# Patient Record
Sex: Male | Born: 1960 | Race: White | Hispanic: No | Marital: Single | Smoking: Former smoker
Health system: Southern US, Community
[De-identification: ages and names within clinical notes are randomized; demographics above are authoritative.]

## PROBLEM LIST (undated history)

## (undated) DIAGNOSIS — I10 Essential (primary) hypertension: Secondary | ICD-10-CM

## (undated) DIAGNOSIS — T7840XA Allergy, unspecified, initial encounter: Secondary | ICD-10-CM

## (undated) DIAGNOSIS — E7800 Pure hypercholesterolemia, unspecified: Secondary | ICD-10-CM

## (undated) HISTORY — PX: OTHER SURGICAL HISTORY: SHX169

## (undated) HISTORY — DX: Allergy, unspecified, initial encounter: T78.40XA

---

## 2003-11-14 ENCOUNTER — Emergency Department (HOSPITAL_COMMUNITY): Admission: EM | Admit: 2003-11-14 | Discharge: 2003-11-14 | Payer: Self-pay | Admitting: Internal Medicine

## 2016-02-07 ENCOUNTER — Emergency Department (HOSPITAL_COMMUNITY): Payer: Worker's Compensation

## 2016-02-07 ENCOUNTER — Encounter (HOSPITAL_COMMUNITY): Payer: Self-pay | Admitting: Emergency Medicine

## 2016-02-07 ENCOUNTER — Emergency Department (HOSPITAL_COMMUNITY)
Admission: EM | Admit: 2016-02-07 | Discharge: 2016-02-07 | Disposition: A | Discharge status: Home / Self Care | Payer: Worker's Compensation | Attending: Emergency Medicine | Admitting: Emergency Medicine

## 2016-02-07 DIAGNOSIS — Y990 Civilian activity done for income or pay: Secondary | ICD-10-CM | POA: Diagnosis not present

## 2016-02-07 DIAGNOSIS — Z87891 Personal history of nicotine dependence: Secondary | ICD-10-CM | POA: Insufficient documentation

## 2016-02-07 DIAGNOSIS — X501XXA Overexertion from prolonged static or awkward postures, initial encounter: Secondary | ICD-10-CM | POA: Diagnosis not present

## 2016-02-07 DIAGNOSIS — S9001XA Contusion of right ankle, initial encounter: Secondary | ICD-10-CM | POA: Diagnosis not present

## 2016-02-07 DIAGNOSIS — Y9389 Activity, other specified: Secondary | ICD-10-CM | POA: Diagnosis not present

## 2016-02-07 DIAGNOSIS — Y9289 Other specified places as the place of occurrence of the external cause: Secondary | ICD-10-CM | POA: Diagnosis not present

## 2016-02-07 DIAGNOSIS — S99911A Unspecified injury of right ankle, initial encounter: Secondary | ICD-10-CM | POA: Diagnosis present

## 2016-02-07 DIAGNOSIS — S8254XA Nondisplaced fracture of medial malleolus of right tibia, initial encounter for closed fracture: Secondary | ICD-10-CM | POA: Diagnosis not present

## 2016-02-07 DIAGNOSIS — S8251XA Displaced fracture of medial malleolus of right tibia, initial encounter for closed fracture: Secondary | ICD-10-CM

## 2016-02-07 MED ORDER — HYDROCODONE-ACETAMINOPHEN 5-325 MG PO TABS: 1.0000 | ORAL_TABLET | ORAL | Status: DC | PRN

## 2016-02-07 NOTE — Discharge Instructions (Signed)
Continue ibuprofen for pain. Take Norco for severe pain only. Keep your ankle elevated as much as possible during the day and at nighttime. Do not remove the splint. Ice several times a day. Please call and follow-up with orthopedic specialist as soon as able. Use crutches for ambulation, do not put any weight on your ankle.   Ankle Fracture A fracture is a break in a bone. The ankle joint is made up of three bones. These include the lower (distal)sections of your lower leg bones, called the tibia and fibula, along with a bone in your foot, called the talus. Depending on how bad the break is and if more than one ankle joint bone is broken, a cast or splint is used to protect and keep your injured bone from moving while it heals. Sometimes, surgery is required to help the fracture heal properly.  There are two general types of fractures:  Stable fracture. This includes a single fracture line through one bone, with no injury to ankle ligaments. A fracture of the talus that does not have any displacement (movement of the bone on either side of the fracture line) is also stable.  Unstable fracture. This includes more than one fracture line through one or more bones in the ankle joint. It also includes fractures that have displacement of the bone on either side of the fracture line. CAUSES  A direct blow to the ankle.   Quickly and severely twisting your ankle.  Trauma, such as a car accident or falling from a significant height. RISK FACTORS You may be at a higher risk of ankle fracture if:  You have certain medical conditions.  You are involved in high-impact sports.  You are involved in a high-impact car accident. SIGNS AND SYMPTOMS   Tender and swollen ankle.  Bruising around the injured ankle.  Pain on movement of the ankle.  Difficulty walking or putting weight on the ankle.  A cold foot below the site of the ankle injury. This can occur if the blood vessels passing through your  injured ankle were also damaged.  Numbness in the foot below the site of the ankle injury. DIAGNOSIS  An ankle fracture is usually diagnosed with a physical exam and X-rays. A CT scan may also be required for complex fractures. TREATMENT  Stable fractures are treated with a cast or splint and using crutches to avoid putting weight on your injured ankle. This is followed by an ankle strengthening program. Some patients require a special type of cast, depending on other medical problems they may have. Unstable fractures require surgery to ensure the bones heal properly. Your health care provider will tell you what type of fracture you have and the best treatment for your condition. HOME CARE INSTRUCTIONS   Review correct crutch use with your health care provider and use your crutches as directed. Safe use of crutches is extremely important. Misuse of crutches can cause you to fall or cause injury to nerves in your hands or armpits.  Do not put weight or pressure on the injured ankle until directed by your health care provider.  To lessen the swelling, keep the injured leg elevated while sitting or lying down.  Apply ice to the injured area:  Put ice in a plastic bag.  Place a towel between your cast and the bag.  Leave the ice on for 20 minutes, 2-3 times a day.  If you have a plaster or fiberglass cast:  Do not try to scratch the skin  under the cast with any objects. This can increase your risk of skin infection.  Check the skin around the cast every day. You may put lotion on any red or sore areas.  Keep your cast dry and clean.  If you have a plaster splint:  Wear the splint as directed.  You may loosen the elastic around the splint if your toes become numb, tingle, or turn cold or blue.  Do not put pressure on any part of your cast or splint; it may break. Rest your cast only on a pillow the first 24 hours until it is fully hardened.  Your cast or splint can be protected  during bathing with a plastic bag sealed to your skin with medical tape. Do not lower the cast or splint into water.  Take medicines as directed by your health care provider. Only take over-the-counter or prescription medicines for pain, discomfort, or fever as directed by your health care provider.  Do not drive a vehicle until your health care provider specifically tells you it is safe to do so.  If your health care provider has given you a follow-up appointment, it is very important to keep that appointment. Not keeping the appointment could result in a chronic or permanent injury, pain, and disability. If you have any problem keeping the appointment, call the facility for assistance. SEEK MEDICAL CARE IF: You develop increased swelling or discomfort. SEEK IMMEDIATE MEDICAL CARE IF:   Your cast gets damaged or breaks.  You have continued severe pain.  You develop new pain or swelling after the cast was put on.  Your skin or toenails below the injury turn blue or gray.  Your skin or toenails below the injury feel cold, numb, or have loss of sensitivity to touch.  There is a bad smell or pus draining from under the cast. MAKE SURE YOU:   Understand these instructions.  Will watch your condition.  Will get help right away if you are not doing well or get worse.   This information is not intended to replace advice given to you by your health care provider. Make sure you discuss any questions you have with your health care provider.   Document Released: 10/31/2000 Document Revised: 11/08/2013 Document Reviewed: 06/02/2013 Elsevier Interactive Patient Education 2016 [employer]  Cast or Splint Care Casts and splints support injured limbs and keep bones from moving while they heal. It is important to care for your cast or splint at home.  HOME CARE INSTRUCTIONS  Keep the cast or splint uncovered during the drying period. It can take 24 to 48 hours to dry if it is made of  plaster. A fiberglass cast will dry in less than 1 hour.  Do not rest the cast on anything harder than a pillow for the first 24 hours.  Do not put weight on your injured limb or apply pressure to the cast until your health care provider gives you permission.  Keep the cast or splint dry. Wet casts or splints can lose their shape and may not support the limb as well. A wet cast that has lost its shape can also create harmful pressure on your skin when it dries. Also, wet skin can become infected.  Cover the cast or splint with a plastic bag when bathing or when out in the rain or snow. If the cast is on the trunk of the body, take sponge baths until the cast is removed.  If your cast does become wet, dry  it with a towel or a blow dryer on the cool setting only.  Keep your cast or splint clean. Soiled casts may be wiped with a moistened cloth.  Do not place any hard or soft foreign objects under your cast or splint, such as cotton, toilet paper, lotion, or powder.  Do not try to scratch the skin under the cast with any object. The object could get stuck inside the cast. Also, scratching could lead to an infection. If itching is a problem, use a blow dryer on a cool setting to relieve discomfort.  Do not trim or cut your cast or remove padding from inside of it.  Exercise all joints next to the injury that are not immobilized by the cast or splint. For example, if you have a long leg cast, exercise the hip joint and toes. If you have an arm cast or splint, exercise the shoulder, elbow, thumb, and fingers.  Elevate your injured arm or leg on 1 or 2 pillows for the first 1 to 3 days to decrease swelling and pain.It is best if you can comfortably elevate your cast so it is higher than your heart. SEEK MEDICAL CARE IF:   Your cast or splint cracks.  Your cast or splint is too tight or too loose.  You have unbearable itching inside the cast.  Your cast becomes wet or develops a soft spot or  area.  You have a bad smell coming from inside your cast.  You get an object stuck under your cast.  Your skin around the cast becomes red or raw.  You have new pain or worsening pain after the cast has been applied. SEEK IMMEDIATE MEDICAL CARE IF:   You have fluid leaking through the cast.  You are unable to move your fingers or toes.  You have discolored (blue or white), cool, painful, or very swollen fingers or toes beyond the cast.  You have tingling or numbness around the injured area.  You have severe pain or pressure under the cast.  You have any difficulty with your breathing or have shortness of breath.  You have chest pain.   This information is not intended to replace advice given to you by your health care provider. Make sure you discuss any questions you have with your health care provider.   Document Released: 10/31/2000 Document Revised: 08/24/2013 Document Reviewed: 05/12/2013 Elsevier Interactive Patient Education [employer]

## 2016-02-07 NOTE — ED Provider Notes (Signed)
CSN: [id number]     Arrival date & time 02/07/16  0421 History   First MD Initiated Contact with Patient 02/07/16 [phone]     Chief Complaint  Patient presents with  . 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 Patient states he was at work, states he was moving heavy dense of tobacco, when his ankle twisted. He states he has pain when walking on the ankle and reports swelling and bruising to the medial aspect. He reports he elevated at night, took ibuprofen which helped, but it continues to be painful and he still cannot walk on it. He denies any prior ankle injuries. He denies any knee pain. He denies any numbness or weakness to the foot or toes. He denies any other injuries or fall.  History reviewed. No pertinent past medical history. History reviewed. No pertinent past surgical history. Family History  Problem Relation Age of Onset  . CAD Mother   . CAD Father    Social History  Substance Use Topics  . Smoking status: Former Games developermoker  . Smokeless tobacco: None  . Alcohol Use: No    Review of Systems  Constitutional: Negative for fever and chills.  Respiratory: Negative for cough, chest tightness and shortness of breath.   Cardiovascular: Negative for chest pain, palpitations and leg swelling.  Musculoskeletal: Positive for joint swelling and arthralgias.  Skin: Negative for rash.  Allergic/Immunologic: Negative for immunocompromised state.  Neurological: Negative for dizziness, weakness, light-headedness, numbness and headaches.  All other systems reviewed and are negative.     Allergies  Review of patient's allergies indicates no known allergies.  Home Medications   Prior to Admission medications   Not on File   BP 113/84 mmHg  Pulse 67  Temp(Src) 97.6 F (36.4 C) (Oral)  Resp  18  SpO2 99% Physical Exam  Constitutional: He is oriented to person, place, and time. He appears well-developed and well-nourished. No distress.  HENT:  Head: Normocephalic and atraumatic.  Eyes: Conjunctivae are normal.  Neck: Neck supple.  Cardiovascular: Normal rate, regular rhythm and normal heart sounds.   Pulmonary/Chest: Effort normal. No respiratory distress. He has no wheezes. He has no rales.  Musculoskeletal: He exhibits no edema.  Moderate swelling noted to the right ankle. There is bruising of the medial aspect of the ankle. Achilles tendon is tender but appears to be intact. Muscular tenderness over medial malleolus. Foot appears to be normal without tenderness to palpation. Dorsal pedal pulse intact. Toes are pink, warm. Sensation is intact. Cap refill less than 2 seconds. Swelling extends into the calf. No knee joint tenderness or pain with range of motion.  Neurological: He is alert and oriented to person, place, and time.  Skin: Skin is warm and dry.  Nursing note and vitals reviewed.   ED Course  Procedures (including critical care time) Labs Review Labs Reviewed - No data to display  Imaging Review Dg Ankle Complete Right  02/07/2016  CLINICAL DATA:  Initial evaluation for acute injury, swelling. EXAM: RIGHT ANKLE - COMPLETE 3+ VIEW COMPARISON:  None. FINDINGS: There is an acute transverse fracture through the medial malleoli S. 3 mm distraction without significant angulation or displacement. Osseous fragment just inferior to the fracture malleable is likely a sequela of remote trauma. Lateral malleoli S and posterior malleable is intact. Talar dome intact. Ankle mortise approximated. Diffuse soft tissue  swelling about the ankle. IMPRESSION: 1. Acute transverse fracture through the medial malleolus without significant displacement. 2. Diffuse soft tissue swelling about the ankle. Electronically Signed   By: Rise Mu M.D.   On: 02/07/2016 05:53   I have  personally reviewed and evaluated these images and lab results as part of my medical decision-making.   EKG Interpretation None      MDM   Final diagnoses:  Fractured medial malleolus, right, closed, initial encounter   Patient with twisting injury to the right ankle last night. He has significant swelling and bruising to the medial aspect of the ankle. X-ray shows acute transverse fracture through medial malleolus. There is no displacement. Discussed results with patient. Will placed in a splint, crutches provided, ibuprofen and Norco for pain at home. Follow with orthopedic specialist. To keep ankle elevated to minimize the swelling, ice several times a day. No weightbearing.  Filed Vitals:   02/07/16 0451 02/07/16 0816  BP: 113/84 120/88  Pulse: 67 70  Temp: 97.6 F (36.4 C)   TempSrc: Oral   Resp: 18 18  SpO2: 99% 100%     Jaynie Crumble, PA-C 02/07/16 0841  Azalia Bilis, MD 02/07/16 [phone]

## 2016-02-07 NOTE — ED Notes (Signed)
Pt states he injured his right ankle yesterday at work  Pt states he iced it and wrapped it with an ace wrap  Pt states tonight he got out of bed and retwisted it and the pain is much worse  Pt has swelling noted to the ankle and pt states he cannot bear weight on it now

## 2016-05-04 ENCOUNTER — Encounter (HOSPITAL_COMMUNITY): Payer: Self-pay | Admitting: Emergency Medicine

## 2016-05-04 ENCOUNTER — Emergency Department (HOSPITAL_COMMUNITY)
Admission: EM | Admit: 2016-05-04 | Discharge: 2016-05-04 | Disposition: A | Discharge status: Home / Self Care | Payer: Commercial Managed Care - HMO | Attending: Emergency Medicine | Admitting: Emergency Medicine

## 2016-05-04 DIAGNOSIS — R0602 Shortness of breath: Secondary | ICD-10-CM | POA: Diagnosis present

## 2016-05-04 DIAGNOSIS — Z79899 Other long term (current) drug therapy: Secondary | ICD-10-CM | POA: Diagnosis not present

## 2016-05-04 DIAGNOSIS — I10 Essential (primary) hypertension: Secondary | ICD-10-CM | POA: Diagnosis not present

## 2016-05-04 DIAGNOSIS — Y939 Activity, unspecified: Secondary | ICD-10-CM | POA: Insufficient documentation

## 2016-05-04 DIAGNOSIS — R42 Dizziness and giddiness: Secondary | ICD-10-CM | POA: Insufficient documentation

## 2016-05-04 DIAGNOSIS — T7840XA Allergy, unspecified, initial encounter: Secondary | ICD-10-CM | POA: Insufficient documentation

## 2016-05-04 DIAGNOSIS — S30861A Insect bite (nonvenomous) of abdominal wall, initial encounter: Secondary | ICD-10-CM

## 2016-05-04 DIAGNOSIS — W57XXXA Bitten or stung by nonvenomous insect and other nonvenomous arthropods, initial encounter: Secondary | ICD-10-CM | POA: Diagnosis not present

## 2016-05-04 DIAGNOSIS — Y999 Unspecified external cause status: Secondary | ICD-10-CM | POA: Diagnosis not present

## 2016-05-04 DIAGNOSIS — Z87891 Personal history of nicotine dependence: Secondary | ICD-10-CM | POA: Diagnosis not present

## 2016-05-04 DIAGNOSIS — Z791 Long term (current) use of non-steroidal anti-inflammatories (NSAID): Secondary | ICD-10-CM | POA: Diagnosis not present

## 2016-05-04 DIAGNOSIS — Y929 Unspecified place or not applicable: Secondary | ICD-10-CM | POA: Insufficient documentation

## 2016-05-04 HISTORY — DX: Pure hypercholesterolemia, unspecified: E78.00

## 2016-05-04 HISTORY — DX: Essential (primary) hypertension: I10

## 2016-05-04 LAB — I-STAT CHEM 8, ED
BUN: 18 mg/dL (ref 6–20)
Calcium, Ion: 1.11 mmol/L — ABNORMAL LOW (ref 1.12–1.23)
Chloride: 97 mmol/L — ABNORMAL LOW (ref 101–111)
Creatinine, Ser: 1 mg/dL (ref 0.61–1.24)
GLUCOSE: 108 mg/dL — AB (ref 65–99)
HEMATOCRIT: 54 % — AB (ref 39.0–52.0)
HEMOGLOBIN: 18.4 g/dL — AB (ref 13.0–17.0)
POTASSIUM: 3.8 mmol/L (ref 3.5–5.1)
Sodium: 136 mmol/L (ref 135–145)
TCO2: 25 mmol/L (ref 0–100)

## 2016-05-04 LAB — CBC WITH DIFFERENTIAL/PLATELET
BASOS ABS: 0 10*3/uL (ref 0.0–0.1)
BASOS PCT: 0 %
EOS PCT: 0 %
Eosinophils Absolute: 0 10*3/uL (ref 0.0–0.7)
HCT: 51 % (ref 39.0–52.0)
Hemoglobin: 18.3 g/dL — ABNORMAL HIGH (ref 13.0–17.0)
Lymphocytes Relative: 11 %
Lymphs Abs: 2 10*3/uL (ref 0.7–4.0)
MCH: 32.5 pg (ref 26.0–34.0)
MCHC: 35.9 g/dL (ref 30.0–36.0)
MCV: 90.6 fL (ref 78.0–100.0)
MONO ABS: 0.9 10*3/uL (ref 0.1–1.0)
Monocytes Relative: 5 %
Neutro Abs: 14.2 10*3/uL — ABNORMAL HIGH (ref 1.7–7.7)
Neutrophils Relative %: 84 %
PLATELETS: 310 10*3/uL (ref 150–400)
RBC: 5.63 MIL/uL (ref 4.22–5.81)
RDW: 11.8 % (ref 11.5–15.5)
WBC: 17 10*3/uL — ABNORMAL HIGH (ref 4.0–10.5)

## 2016-05-04 MED ORDER — DOXYCYCLINE HYCLATE 100 MG PO TABS: 100.0000 mg | ORAL_TABLET | Freq: Two times a day (BID) | ORAL | Status: DC

## 2016-05-04 MED ORDER — FAMOTIDINE IN NACL 20-0.9 MG/50ML-% IV SOLN
20.0000 mg | Freq: Once | INTRAVENOUS | Status: AC
  Administered 2016-05-04: 20 mg via INTRAVENOUS
  Filled 2016-05-04: qty 50

## 2016-05-04 MED ORDER — DIPHENHYDRAMINE HCL 50 MG/ML IJ SOLN
25.0000 mg | Freq: Once | INTRAMUSCULAR | Status: AC
  Administered 2016-05-04: 25 mg via INTRAVENOUS
  Filled 2016-05-04: qty 1

## 2016-05-04 MED ORDER — EPINEPHRINE 0.3 MG/0.3ML IJ SOAJ: 0.3000 mg | Freq: Once | INTRAMUSCULAR | Status: DC

## 2016-05-04 MED ORDER — FAMOTIDINE 20 MG PO TABS: 20.0000 mg | ORAL_TABLET | Freq: Two times a day (BID) | ORAL | Status: DC

## 2016-05-04 MED ORDER — METHYLPREDNISOLONE SODIUM SUCC 125 MG IJ SOLR
125.0000 mg | Freq: Once | INTRAMUSCULAR | Status: AC
  Administered 2016-05-04: 125 mg via INTRAVENOUS
  Filled 2016-05-04: qty 2

## 2016-05-04 MED ORDER — SODIUM CHLORIDE 0.9 % IV BOLUS (SEPSIS)
1000.0000 mL | Freq: Once | INTRAVENOUS | Status: AC
  Administered 2016-05-04: 1000 mL via INTRAVENOUS

## 2016-05-04 MED ORDER — PREDNISONE 10 MG PO TABS: 20.0000 mg | ORAL_TABLET | Freq: Every day | ORAL | Status: DC

## 2016-05-04 MED ORDER — DOXYCYCLINE HYCLATE 100 MG PO TABS
100.0000 mg | ORAL_TABLET | Freq: Once | ORAL | Status: AC
  Administered 2016-05-04: 100 mg via ORAL
  Filled 2016-05-04: qty 1

## 2016-05-04 NOTE — ED Provider Notes (Signed)
CSN: [id number]     Arrival date & time 05/04/16  2122 History   First MD Initiated Contact with Patient 05/04/16 2126     Chief Complaint  Patient presents with  . Dizziness  . Allergic Reaction     (Consider location/radiation/quality/duration/timing/severity/associated sxs/prior Treatment) HPI Comments: This a 55 year old male with a history of hypertension, high cholesterol who states that he went out for dinner with his family for Father's Day.  He had a hamburger and shortly after that he became dizzy, developed a urticarial rash had some shortness of breath and diffuse peripheral swelling.  He has taken 75 mg of by mouth Benadryl with little relief.  He states if he stands or changes positions using chronically dizzy.  His family member says when this happens he gets very pale.  He does report that he has taken for tics off of areas parts of his body since March, the last one approximately 2 weeks ago.  These all were removed.  Complete he did develop a reddened area at the site of the tick bite, but no rash noted.  The locations.  Patient is a 55 y.o. male presenting with dizziness and allergic reaction. The history is provided by the patient.  Dizziness Quality:  Unable to specify Severity:  Moderate Onset quality:  Gradual Duration:  2 hours Timing:  Constant Progression:  Worsening Chronicity:  New Context: standing up   Relieved by:  Lying down Worsened by:  Sitting upright and movement Ineffective treatments:  None tried Associated symptoms: palpitations and shortness of breath   Associated symptoms: no headaches   Allergic Reaction Presenting symptoms: rash     Past Medical History  Diagnosis Date  . Hypertension   . Elevated cholesterol    History reviewed. No pertinent past surgical history. Family History  Problem Relation Age of Onset  . CAD Mother   . CAD Father    Social History  Substance Use Topics  . Smoking status: Former Games developermoker  . Smokeless  tobacco: None  . Alcohol Use: No    Review of Systems  Constitutional: Negative for fever and chills.  Respiratory: Positive for shortness of breath.   Cardiovascular: Positive for palpitations.  Musculoskeletal: Positive for joint swelling.  Skin: Positive for rash.  Neurological: Positive for dizziness. Negative for syncope and headaches.  All other systems reviewed and are negative.     Allergies  Review of patient's allergies indicates no known allergies.  Home Medications   Prior to Admission medications   Medication Sig Start Date End Date Taking? Authorizing Provider  ibuprofen (ADVIL,MOTRIN) 400 MG tablet Take 400 mg by mouth every 6 (six) hours as needed.   Yes Historical Provider, MD  loratadine (CLARITIN) 10 MG tablet Take 10 mg by mouth daily.   Yes Historical Provider, MD  losartan (COZAAR) 50 MG tablet Take 1 tablet by mouth daily. 04/01/16  Yes Historical Provider, MD  rosuvastatin (CRESTOR) 20 MG tablet Take 20 mg by mouth daily. 03/15/16  Yes Historical Provider, MD  doxycycline (VIBRA-TABS) 100 MG tablet Take 1 tablet (100 mg total) by mouth 2 (two) times daily. 05/04/16   Earley FavorGail Kyasia Steuck, NP  EPINEPHrine (EPIPEN 2-PAK) 0.3 mg/0.3 mL IJ SOAJ injection Inject 0.3 mLs (0.3 mg total) into the muscle once. 05/04/16   Earley FavorGail Nehemias Sauceda, NP  famotidine (PEPCID) 20 MG tablet Take 1 tablet (20 mg total) by mouth 2 (two) times daily. 05/04/16   Earley FavorGail Mckade Gurka, NP  HYDROcodone-acetaminophen (NORCO) 5-325 MG tablet Take 1 tablet  by mouth every 4 (four) hours as needed for moderate pain. 02/07/16   Tatyana Kirichenko, PA-C  predniSONE (DELTASONE) 10 MG tablet Take 2 tablets (20 mg total) by mouth daily. 05/04/16   Earley Favor, NP   BP 91/74 mmHg  Pulse 90  Temp(Src) 97.6 F (36.4 C) (Oral)  Resp 19  Ht  (1.727 m)  Wt 78.019 kg  BMI 26.16 kg/m2  SpO2 97% Physical Exam  Constitutional: He is oriented to person, place, and time. He appears well-developed and well-nourished.  HENT:   Head: Normocephalic.  Eyes: Pupils are equal, round, and reactive to light.  Neck: Normal range of motion.  Cardiovascular: Normal rate and regular rhythm.   Hypotensive  Pulmonary/Chest: Effort normal. No respiratory distress. He has no wheezes.  Abdominal: Soft.  Musculoskeletal: He exhibits edema.  Neurological: He is alert and oriented to person, place, and time.  Skin: Skin is warm. There is pallor.  Nursing note and vitals reviewed.   ED Course  Procedures (including critical care time) Labs Review Labs Reviewed  CBC WITH DIFFERENTIAL/PLATELET - Abnormal; Notable for the following:    WBC 17.0 (*)    Hemoglobin 18.3 (*)    Neutro Abs 14.2 (*)    All other components within normal limits  I-STAT CHEM 8, ED - Abnormal; Notable for the following:    Chloride 97 (*)    Glucose, Bld 108 (*)    Calcium, Ion 1.11 (*)    Hemoglobin 18.4 (*)    HCT 54.0 (*)    All other components within normal limits  B. BURGDORFI ANTIBODIES    Imaging Review No results found. I have personally reviewed and evaluated these images and lab results as part of my medical decision-making.   EKG Interpretation None    Patient's allergic reaction is improving.  He is no longer hypotensive nor tachycardic.  He is no longer having any discomfort, shortness of breath.  Swelling has decreased significantly.  His color has improved to normal.  He'll be discharged him with an EpiPen, prednisone burst for 5 days, Pepcid on a regular basis and due to the multiple recent tick bites.  I will start him on doxycycline 100 mg twice a day for 7 days.  A Lyme titer has been sent to the lab for evaluation.  This has been discussed with the patient if positive, he will be notified.  MDM   Final diagnoses:  Allergic reaction, initial encounter  Tick bite of abdomen, initial encounter         Earley Favor, NP 05/04/16 2322  Leta Baptist, MD 05/07/16 0800

## 2016-05-04 NOTE — Discharge Instructions (Signed)
You have been given a prescription for doxycycline.  Please take this as directed.  This is to treat tick born illnesses You have been given a prescription for an EpiPen.  Please fill this and keep this with you at all times use as directed.  If you start having shortness of breath, rash, lightheadedness, symptoms similar to tonight's episode and immediately call 911 for transport to the emergency department You have  been given a prescription for Pepcid which is a very potent antihistamine slim, similar to Benadryl, but without the sleepy side effect as well as a prescription for prednisone.  This is a potent steroid.  Please take this as directed until all tablets have been completed

## 2016-05-04 NOTE — ED Notes (Signed)
Pt reports understanding of discharge information. No questions at time of discharge 

## 2016-05-04 NOTE — ED Notes (Signed)
Pt reports improvement in symptoms after administration of medications.

## 2016-05-04 NOTE — ED Notes (Signed)
Pt reports redness and itching that developed around 1700 today. Pt reports having multiple tick bites over the last few weeks and pt reports eating beef today. Pt denies any food allergies and currently not experiencing any airway difficulties.

## 2016-05-04 NOTE — ED Notes (Signed)
Pt reports that he had dizziness that started today around 1730. Pt also reports redness and swelling. Pt reports pulling ticks off of chest recently. Pt alert and oriented x 4 at this time.

## 2016-05-06 LAB — B. BURGDORFI ANTIBODIES: B burgdorferi Ab IgG+IgM: <0.91 {ISR} (ref 0.00–0.90)

## 2017-01-03 DIAGNOSIS — R21 Rash and other nonspecific skin eruption: Secondary | ICD-10-CM | POA: Diagnosis not present

## 2017-01-17 DIAGNOSIS — E559 Vitamin D deficiency, unspecified: Secondary | ICD-10-CM | POA: Diagnosis not present

## 2017-01-17 DIAGNOSIS — E7800 Pure hypercholesterolemia, unspecified: Secondary | ICD-10-CM | POA: Diagnosis not present

## 2017-01-17 DIAGNOSIS — Z79899 Other long term (current) drug therapy: Secondary | ICD-10-CM | POA: Diagnosis not present

## 2017-01-17 DIAGNOSIS — I10 Essential (primary) hypertension: Secondary | ICD-10-CM | POA: Diagnosis not present

## 2017-01-31 DIAGNOSIS — E7800 Pure hypercholesterolemia, unspecified: Secondary | ICD-10-CM | POA: Diagnosis not present

## 2017-01-31 DIAGNOSIS — E559 Vitamin D deficiency, unspecified: Secondary | ICD-10-CM | POA: Diagnosis not present

## 2017-01-31 DIAGNOSIS — I10 Essential (primary) hypertension: Secondary | ICD-10-CM | POA: Diagnosis not present

## 2017-02-04 ENCOUNTER — Encounter (HOSPITAL_COMMUNITY): Payer: Self-pay | Admitting: Emergency Medicine

## 2017-02-04 ENCOUNTER — Emergency Department (HOSPITAL_COMMUNITY)
Admission: EM | Admit: 2017-02-04 | Discharge: 2017-02-04 | Disposition: A | Discharge status: Home / Self Care | Payer: Commercial Managed Care - HMO | Attending: Emergency Medicine | Admitting: Emergency Medicine

## 2017-02-04 DIAGNOSIS — Z87891 Personal history of nicotine dependence: Secondary | ICD-10-CM | POA: Diagnosis not present

## 2017-02-04 DIAGNOSIS — I10 Essential (primary) hypertension: Secondary | ICD-10-CM | POA: Insufficient documentation

## 2017-02-04 DIAGNOSIS — L509 Urticaria, unspecified: Secondary | ICD-10-CM | POA: Diagnosis not present

## 2017-02-04 DIAGNOSIS — R21 Rash and other nonspecific skin eruption: Secondary | ICD-10-CM | POA: Diagnosis present

## 2017-02-04 MED ORDER — FAMOTIDINE 20 MG PO TABS
20.0000 mg | ORAL_TABLET | Freq: Once | ORAL | Status: AC
Start: 2017-02-04 — End: 2017-02-04
  Administered 2017-02-04: 20 mg via ORAL
  Filled 2017-02-04: qty 1

## 2017-02-04 MED ORDER — PREDNISONE 20 MG PO TABS: 40.0000 mg | ORAL_TABLET | Freq: Every day | ORAL | 0 refills | Status: DC

## 2017-02-04 MED ORDER — PREDNISONE 20 MG PO TABS
60.0000 mg | ORAL_TABLET | Freq: Once | ORAL | Status: AC
  Administered 2017-02-04: 60 mg via ORAL
  Filled 2017-02-04: qty 3

## 2017-02-04 MED ORDER — DIPHENHYDRAMINE HCL 25 MG PO CAPS: 25.0000 mg | ORAL_CAPSULE | Freq: Four times a day (QID) | ORAL | 0 refills | Status: AC | PRN

## 2017-02-04 MED ORDER — DIPHENHYDRAMINE HCL 25 MG PO CAPS
25.0000 mg | ORAL_CAPSULE | Freq: Once | ORAL | Status: AC
  Administered 2017-02-04: 25 mg via ORAL
  Filled 2017-02-04: qty 1

## 2017-02-04 MED ORDER — FAMOTIDINE 20 MG PO TABS: 20.0000 mg | ORAL_TABLET | Freq: Two times a day (BID) | ORAL | 0 refills | Status: DC

## 2017-02-04 NOTE — ED Notes (Signed)
Pt states that he has had lyme disease and per pt he is not suppose to eat beef and ate some chili that had beef in it. Since then he has hives and welts  all over his body. Raised red bums on bilateral arms and trunk and a red rash in groin area. Pt states 2 weeks ago he had pulled another tick off of his skin,

## 2017-02-04 NOTE — ED Provider Notes (Signed)
[facility]-EMERGENCY DEPT Provider Note   CSN: [id number] Arrival date & time: 02/04/17  0541     History   Chief Complaint Chief Complaint  Patient presents with  . Rash    HPI Daniel Mack is a 56 y.o. male.  HPI Daniel Mack is a 56 y.o. male with hx of HTN, Alpha-gal allergy, presents to ED with complaint of a rash. Pt states rash started this morning. States has sensetivity and prior allergic reaction from eating meet given exposure to alpha-gal from a tick. Pt states he ate chilli yesterday which contained beef. States rash started this morning. Rash is erythematous, itchy, mainly on arms, legs, lower abdomen. Denies swelling of lips, tongue, throat. No difficulty breathing. No fever, chills, nausea, vomiting, malaise. Took 25mg  of benadryl this morning which helped.   Past Medical History:  Diagnosis Date  . Elevated cholesterol   . Hypertension     There are no active problems to display for this patient.   History reviewed. No pertinent surgical history.     Home Medications    Prior to Admission medications   Medication Sig Start Date End Date Taking? Authorizing Provider  doxycycline (VIBRA-TABS) 100 MG tablet Take 1 tablet (100 mg total) by mouth 2 (two) times daily. 05/04/16   Earley Favor, NP  EPINEPHrine (EPIPEN 2-PAK) 0.3 mg/0.3 mL IJ SOAJ injection Inject 0.3 mLs (0.3 mg total) into the muscle once. 05/04/16   Earley Favor, NP  famotidine (PEPCID) 20 MG tablet Take 1 tablet (20 mg total) by mouth 2 (two) times daily. 05/04/16   Earley Favor, NP  HYDROcodone-acetaminophen (NORCO) 5-325 MG tablet Take 1 tablet by mouth every 4 (four) hours as needed for moderate pain. 02/07/16   Jesly Hartmann, PA-C  ibuprofen (ADVIL,MOTRIN) 400 MG tablet Take 400 mg by mouth every 6 (six) hours as needed.    Historical Provider, MD  loratadine (CLARITIN) 10 MG tablet Take 10 mg by mouth daily.    Historical Provider, MD  losartan (COZAAR) 50 MG tablet Take 1 tablet by mouth  daily. 04/01/16   Historical Provider, MD  predniSONE (DELTASONE) 10 MG tablet Take 2 tablets (20 mg total) by mouth daily. 05/04/16   Earley Favor, NP  rosuvastatin (CRESTOR) 20 MG tablet Take 20 mg by mouth daily. 03/15/16   Historical Provider, MD    Family History Family History  Problem Relation Age of Onset  . CAD Mother   . CAD Father     Social History Social History  Substance Use Topics  . Smoking status: Former Games developer  . Smokeless tobacco: Never Used  . Alcohol use No     Allergies   Beef-derived products   Review of Systems Review of Systems  Constitutional: Negative for chills and fever.  Respiratory: Negative for cough, chest tightness and shortness of breath.   Cardiovascular: Negative for chest pain, palpitations and leg swelling.  Gastrointestinal: Negative for abdominal distention, abdominal pain, diarrhea, nausea and vomiting.  Genitourinary: Negative for dysuria, frequency, hematuria and urgency.  Musculoskeletal: Negative for arthralgias, myalgias, neck pain and neck stiffness.  Skin: Positive for rash.  Allergic/Immunologic: Negative for immunocompromised state.  Neurological: Negative for dizziness, weakness, light-headedness, numbness and headaches.  All other systems reviewed and are negative.    Physical Exam Updated Vital Signs BP (!) 140/92 (BP Location: Left Arm)   Pulse (!) 59   Temp 97.6 F (36.4 C) (Oral)   Resp 18   SpO2 96%   Physical Exam  Constitutional:  He appears well-developed and well-nourished. No distress.  HENT:  Head: Normocephalic and atraumatic.  No oral mucosal rash. No swelling of lips, tongue, throat.  Eyes: Conjunctivae are normal.  Neck: Neck supple.  Cardiovascular: Normal rate, regular rhythm and normal heart sounds.   Pulmonary/Chest: Effort normal. No respiratory distress. He has no wheezes. He has no rales.  No stridor  Musculoskeletal: He exhibits no edema.  Neurological: He is alert.  Skin: Skin is warm  and dry.  Erythematous, macular rash, in congruent patches to the bilateral arms, legs, lower abdomen, groin. Pruritic.   Nursing note and vitals reviewed.    ED Treatments / Results  Labs (all labs ordered are listed, but only abnormal results are displayed) Labs Reviewed - No data to display  EKG  EKG Interpretation None       Radiology No results found.  Procedures Procedures (including critical care time)  Medications Ordered in ED Medications  diphenhydrAMINE (BENADRYL) capsule 25 mg (not administered)  famotidine (PEPCID) tablet 20 mg (not administered)  predniSONE (DELTASONE) tablet 60 mg (not administered)     Initial Impression / Assessment and Plan / ED Course  I have reviewed the triage vital signs and the nursing notes.  Pertinent labs & imaging results that were available during my care of the patient were reviewed by me and considered in my medical decision making (see chart for details).    Pt in ED with rash to bilateral arms, legs, abdomen. Rash consistent with hives. No oral mucosa involvement. No tongue, lip, throat swelling. No difficulty breathing. Plan to dc home with benadryl, pepcid, prednisone burst. Follow up as needed.  Vitals:   02/04/17 0549 02/04/17 0710  BP: (!) 140/92 (!) 140/92  Pulse: (!) 59 66  Resp: 18 18  Temp: 97.6 F (36.4 C) 97.6 F (36.4 C)  TempSrc: Oral Oral  SpO2: 96% 100%     Final Clinical Impressions(s) / ED Diagnoses   Final diagnoses:  Urticaria    New Prescriptions Discharge Medication List as of 02/04/2017  7:06 AM    START taking these medications   Details  diphenhydrAMINE (BENADRYL) 25 mg capsule Take 1 capsule (25 mg total) by mouth every 6 (six) hours as needed., Starting Wed 02/04/2017, Print    !! famotidine (PEPCID) 20 MG tablet Take 1 tablet (20 mg total) by mouth 2 (two) times daily., Starting Wed 02/04/2017, Print    !! predniSONE (DELTASONE) 20 MG tablet Take 2 tablets (40 mg total) by  mouth daily., Starting Wed 02/04/2017, Print     !! - Potential duplicate medications found. Please discuss with provider.       Jaynie Crumbleatyana Sharada Albornoz, PA-C 02/04/17 1518    Dione Boozeavid Glick, MD 02/05/17 [phone]

## 2017-02-04 NOTE — ED Triage Notes (Addendum)
Pt presents to ED with rash to upper left arm and abdomen; pt was bitten by tick in July and now has a sensitivity to beef; pt ate beans around 8pm without realizing they contained beef and then awoke this morning with the rash; no oral swelling noted; pt took 1 benadryl PTA

## 2017-02-04 NOTE — Discharge Instructions (Signed)
Benadryl, pepcid, prednisone as prescribed. Follow up as needed. Return if worsening symptoms. Avoid beef products.

## 2017-02-07 DIAGNOSIS — R945 Abnormal results of liver function studies: Secondary | ICD-10-CM | POA: Diagnosis not present

## 2017-02-07 DIAGNOSIS — R7303 Prediabetes: Secondary | ICD-10-CM | POA: Diagnosis not present

## 2017-02-07 DIAGNOSIS — S30861A Insect bite (nonvenomous) of abdominal wall, initial encounter: Secondary | ICD-10-CM | POA: Diagnosis not present

## 2017-02-12 DIAGNOSIS — R945 Abnormal results of liver function studies: Secondary | ICD-10-CM | POA: Diagnosis not present

## 2017-02-16 DIAGNOSIS — R109 Unspecified abdominal pain: Secondary | ICD-10-CM | POA: Diagnosis not present

## 2017-02-16 DIAGNOSIS — K2970 Gastritis, unspecified, without bleeding: Secondary | ICD-10-CM | POA: Diagnosis not present

## 2017-03-16 DIAGNOSIS — Z7952 Long term (current) use of systemic steroids: Secondary | ICD-10-CM | POA: Diagnosis not present

## 2017-03-16 DIAGNOSIS — E291 Testicular hypofunction: Secondary | ICD-10-CM | POA: Diagnosis not present

## 2017-05-19 ENCOUNTER — Encounter: Payer: Self-pay | Admitting: Family

## 2017-05-19 ENCOUNTER — Ambulatory Visit (INDEPENDENT_AMBULATORY_CARE_PROVIDER_SITE_OTHER): Payer: 59 | Admitting: Family

## 2017-05-19 VITALS — BP 139/91 | HR 66 | Temp 97.3°F | Ht 68.0 in | Wt 168.4 lb

## 2017-05-19 DIAGNOSIS — E785 Hyperlipidemia, unspecified: Secondary | ICD-10-CM

## 2017-05-19 DIAGNOSIS — Z23 Encounter for immunization: Secondary | ICD-10-CM | POA: Diagnosis not present

## 2017-05-19 DIAGNOSIS — I10 Essential (primary) hypertension: Secondary | ICD-10-CM | POA: Diagnosis not present

## 2017-05-19 DIAGNOSIS — Z0000 Encounter for general adult medical examination without abnormal findings: Secondary | ICD-10-CM | POA: Diagnosis not present

## 2017-05-19 DIAGNOSIS — Z1211 Encounter for screening for malignant neoplasm of colon: Secondary | ICD-10-CM

## 2017-05-19 DIAGNOSIS — N529 Male erectile dysfunction, unspecified: Secondary | ICD-10-CM

## 2017-05-19 DIAGNOSIS — Z91018 Allergy to other foods: Secondary | ICD-10-CM

## 2017-05-19 DIAGNOSIS — Z1159 Encounter for screening for other viral diseases: Secondary | ICD-10-CM

## 2017-05-19 MED ORDER — ROSUVASTATIN CALCIUM 10 MG PO TABS: 10.0000 mg | ORAL_TABLET | Freq: Every day | ORAL | 3 refills | Status: DC

## 2017-05-19 MED ORDER — LOSARTAN POTASSIUM 50 MG PO TABS: 50.0000 mg | ORAL_TABLET | Freq: Every day | ORAL | 3 refills | Status: DC

## 2017-05-19 NOTE — Patient Instructions (Signed)

## 2017-05-19 NOTE — Progress Notes (Signed)
Subjective:    Patient ID: Daniel Mack, male    DOB: July 26, 1961, 56 y.o.   MRN: [id number]  Pt presents to the office today to establish care and CPE.  Pt states he has alph gal and would like his lab retested today. Pt states he stepped on a nail Saturday in his right foot. PT is unsure of last TDAP.  PT is followed by Urologists for ED.  Hypertension  This is a chronic problem. The current episode started more than 1 year ago. The problem has been waxing and waning since onset. The problem is uncontrolled. Pertinent negatives include no chest pain, malaise/fatigue, peripheral edema or shortness of breath. Risk factors for coronary artery disease include dyslipidemia, male gender and family history. The current treatment provides mild improvement. There is no history of kidney disease, CAD/MI, CVA or heart failure.  Hyperlipidemia  This is a chronic problem. The current episode started more than 1 year ago. The problem is controlled. Recent lipid tests were reviewed and are normal. Pertinent negatives include no chest pain or shortness of breath. Current antihyperlipidemic treatment includes statins. The current treatment provides moderate improvement of lipids. Risk factors for coronary artery disease include dyslipidemia, family history, hypertension and male sex.      Review of Systems  Constitutional: Negative for malaise/fatigue.  Respiratory: Negative for shortness of breath.   Cardiovascular: Negative for chest pain.  All other systems reviewed and are negative.   Family History  Problem Relation Age of Onset  . CAD Mother   . CAD Father   . Heart attack Father   . COPD Paternal Grandfather        lung    Social History   Social History  . Marital status: Single    Spouse name: N/A  . Number of children: N/A  . Years of education: N/A   Social History Main Topics  . Smoking status: Former Smoker    Types: Cigarettes  . Smokeless tobacco: Never Used  . Alcohol use  No  . Drug use: No  . Sexual activity: Yes   Other Topics Concern  . None   Social History Narrative  . None       Objective:   Physical Exam  Constitutional: He is oriented to person, place, and time. He appears well-developed and well-nourished. No distress.  HENT:  Head: Normocephalic.  Right Ear: External ear normal.  Left Ear: External ear normal.  Nose: Nose normal.  Mouth/Throat: Oropharynx is clear and moist.  Eyes: Pupils are equal, round, and reactive to light. Right eye exhibits no discharge. Left eye exhibits no discharge.  Neck: Normal range of motion. Neck supple. No thyromegaly present.  Cardiovascular: Normal rate, regular rhythm, normal heart sounds and intact distal pulses.   No murmur heard. Pulmonary/Chest: Effort normal and breath sounds normal. No respiratory distress. He has no wheezes.  Abdominal: Soft. Bowel sounds are normal. He exhibits no distension. There is no tenderness.  Musculoskeletal: Normal range of motion. He exhibits no edema or tenderness.  Neurological: He is alert and oriented to person, place, and time.  Skin: Skin is warm and dry. No rash noted. No erythema.  Psychiatric: He has a normal mood and affect. His behavior is normal. Judgment and thought content normal.  Vitals reviewed.    BP (!) 139/91   Pulse 66   Temp 97.3 F (36.3 C) (Oral)   Ht 5' 8"  (1.727 m)   Wt 168 lb 6.4 oz (76.4 kg)  BMI 25.61 kg/m      Assessment & Plan:  1. Annual physical exam - CMP14+EGFR - Lipid panel - Fecal occult blood, imunochemical; Future - Alpha-Gal Panel - VITAMIN D 25 Hydroxy (Vit-D Deficiency, Fractures) - Thyroid Panel With TSH - HIV antibody - Hepatitis C antibody  2. Essential hypertension - losartan (COZAAR) 50 MG tablet; Take 1 tablet (50 mg total) by mouth daily.  Dispense: 90 tablet; Refill: 3 - CMP14+EGFR  3. Hyperlipidemia, unspecified hyperlipidemia type - rosuvastatin (CRESTOR) 10 MG tablet; Take 1 tablet (10 mg  total) by mouth daily.  Dispense: 90 tablet; Refill: 3 - CMP14+EGFR - Lipid panel  4. Allergy to meat  - CMP14+EGFR - Alpha-Gal Panel  5. Colon cancer screening - CMP14+EGFR - Fecal occult blood, imunochemical; Future  6. Need for hepatitis C screening test - CMP14+EGFR - Hepatitis C antibody  7. Erectile dysfunction, unspecified erectile dysfunction type   Continue all meds Labs pending Health Maintenance reviewed Diet and exercise encouraged RTO 1 year  Evelina Dun, FNP

## 2017-05-19 NOTE — Addendum Note (Signed)
Addended by: Lorelee CoverOSTOSKY, Jenette Rayson C on: 05/19/2017 10:26 AM   Modules accepted: Orders

## 2017-05-23 ENCOUNTER — Other Ambulatory Visit: Payer: Self-pay | Admitting: Family

## 2017-05-23 DIAGNOSIS — E559 Vitamin D deficiency, unspecified: Secondary | ICD-10-CM | POA: Insufficient documentation

## 2017-05-23 MED ORDER — VITAMIN D (ERGOCALCIFEROL) 1.25 MG (50000 UNIT) PO CAPS
50000.0000 [IU] | ORAL_CAPSULE | ORAL | 3 refills | Status: DC
Start: 2017-05-23 — End: 2018-04-01

## 2017-05-25 LAB — LIPID PANEL
CHOLESTEROL TOTAL: 153 mg/dL (ref 100–199)
Chol/HDL Ratio: 3.6 ratio (ref 0.0–5.0)
HDL: 42 mg/dL (ref >39)
LDL CALC: 62 mg/dL (ref 0–99)
TRIGLYCERIDES: 245 mg/dL — AB (ref 0–149)
VLDL Cholesterol Cal: 49 mg/dL — ABNORMAL HIGH (ref 5–40)

## 2017-05-25 LAB — HIV ANTIBODY (ROUTINE TESTING W REFLEX): HIV SCREEN 4TH GENERATION: NONREACTIVE

## 2017-05-25 LAB — CMP14+EGFR
ALK PHOS: 78 IU/L (ref 39–117)
ALT: 33 IU/L (ref 0–44)
AST: 32 IU/L (ref 0–40)
Albumin/Globulin Ratio: 2 (ref 1.2–2.2)
Albumin: 5 g/dL (ref 3.5–5.5)
BILIRUBIN TOTAL: 0.2 mg/dL (ref 0.0–1.2)
BUN/Creatinine Ratio: 15 (ref 9–20)
BUN: 13 mg/dL (ref 6–24)
CHLORIDE: 95 mmol/L — AB (ref 96–106)
CO2: 22 mmol/L (ref 20–29)
CREATININE: 0.89 mg/dL (ref 0.76–1.27)
Calcium: 9.6 mg/dL (ref 8.7–10.2)
GFR calc Af Amer: 109 mL/min/{1.73_m2} (ref >59)
GFR calc non Af Amer: 94 mL/min/{1.73_m2} (ref >59)
GLUCOSE: 106 mg/dL — AB (ref 65–99)
Globulin, Total: 2.5 g/dL (ref 1.5–4.5)
Potassium: 4.5 mmol/L (ref 3.5–5.2)
SODIUM: 134 mmol/L (ref 134–144)
Total Protein: 7.5 g/dL (ref 6.0–8.5)

## 2017-05-25 LAB — ALPHA-GAL PANEL
Alpha Gal IgE*: 59 kU/L — ABNORMAL HIGH (ref <0.35)
Beef (Bos spp) IgE: 21.6 kU/L — ABNORMAL HIGH (ref <0.35)
Class Interpretation: 4
LAMB CLASS INTERPRETATION: 3
LAMB/MUTTON (OVIS SPP) IGE: 9.51 kU/L — AB (ref <0.35)
PORK (SUS SPP) IGE: 8.79 kU/L — AB (ref <0.35)
PORK CLASS INTERPRETATION: 3

## 2017-05-25 LAB — THYROID PANEL WITH TSH
Free Thyroxine Index: 1.6 (ref 1.2–4.9)
T3 Uptake Ratio: 28 % (ref 24–39)
T4 TOTAL: 5.7 ug/dL (ref 4.5–12.0)
TSH: 4.03 u[IU]/mL (ref 0.450–4.500)

## 2017-05-25 LAB — VITAMIN D 25 HYDROXY (VIT D DEFICIENCY, FRACTURES): Vit D, 25-Hydroxy: 26.3 ng/mL — ABNORMAL LOW (ref 30.0–100.0)

## 2017-05-25 LAB — HEPATITIS C ANTIBODY

## 2017-06-28 DIAGNOSIS — J0110 Acute frontal sinusitis, unspecified: Secondary | ICD-10-CM | POA: Diagnosis not present

## 2017-07-07 ENCOUNTER — Encounter: Payer: Self-pay | Admitting: Family

## 2017-07-07 ENCOUNTER — Ambulatory Visit (INDEPENDENT_AMBULATORY_CARE_PROVIDER_SITE_OTHER): Payer: 59 | Admitting: Family

## 2017-07-07 VITALS — BP 136/95 | HR 69 | Temp 97.0°F | Ht 68.0 in | Wt 178.6 lb

## 2017-07-07 DIAGNOSIS — S0502XA Injury of conjunctiva and corneal abrasion without foreign body, left eye, initial encounter: Secondary | ICD-10-CM

## 2017-07-07 MED ORDER — KETOROLAC TROMETHAMINE (PF) 0.45 % OP SOLN: OPHTHALMIC | 0 refills | Status: DC

## 2017-07-07 MED ORDER — ERYTHROMYCIN 5 MG/GM OP OINT: 1.0000 "application " | TOPICAL_OINTMENT | Freq: Four times a day (QID) | OPHTHALMIC | 0 refills | Status: DC

## 2017-07-07 NOTE — Progress Notes (Signed)
   Subjective:    Patient ID: Daniel Mack, male    DOB: Nov 07, 1961, 56 y.o.   MRN: [id number]  Pt presents to the office with left eye pain. PT states he is unsure what happened, but was "cutting hay" on Saturday and felt something in his eye and rubbed his eye. PT states his erythemas and tender.  Eye Problem   The left eye is affected. This is a new problem. The problem occurs constantly. The problem has been unchanged. The injury mechanism was a foreign body. The pain is at a severity of 8/10. The pain is mild. There is no known exposure to pink eye. Associated symptoms include eye redness and itching. Pertinent negatives include no blurred vision, eye discharge, double vision, fever, nausea, photophobia or vomiting. He has tried eye drops and commercial eye wash for the symptoms. The treatment provided mild relief.      Review of Systems  Constitutional: Negative for fever.  Eyes: Positive for redness and itching. Negative for blurred vision, double vision, photophobia and discharge.  Gastrointestinal: Negative for nausea and vomiting.  All other systems reviewed and are negative.      Objective:   Physical Exam  Constitutional: He is oriented to person, place, and time. He appears well-developed and well-nourished. No distress.  HENT:  Head: Normocephalic.  Right Ear: External ear normal.  Left Ear: External ear normal.  Mouth/Throat: Oropharynx is clear and moist.  Eyes: Pupils are equal, round, and reactive to light. Right eye exhibits no discharge. Left eye exhibits discharge. No foreign body (small abrasion on medial cornea) present in the left eye. Left conjunctiva is injected.  Neck: Normal range of motion. Neck supple. No thyromegaly present.  Cardiovascular: Normal rate, regular rhythm, normal heart sounds and intact distal pulses.   No murmur heard. Pulmonary/Chest: Effort normal and breath sounds normal. No respiratory distress. He has no wheezes.  Abdominal: Soft.  Bowel sounds are normal. He exhibits no distension. There is no tenderness.  Musculoskeletal: Normal range of motion. He exhibits no edema or tenderness.  Neurological: He is alert and oriented to person, place, and time. He has normal reflexes. No cranial nerve deficit.  Skin: Skin is warm and dry. No rash noted. No erythema.  Psychiatric: He has a normal mood and affect. His behavior is normal. Judgment and thought content normal.  Vitals reviewed.     BP (!) 136/95   Pulse 69   Temp (!) 97 F (36.1 C) (Oral)   Ht 5\' 8"  (1.727 m)   Wt 178 lb 9.6 oz (81 kg)   BMI 27.16 kg/m      Assessment & Plan:  1. Abrasion of left cornea, initial encounter Do not rub eyes Cool compresses Wear protective eye wear when working RTO prn  - erythromycin [facility]) ophthalmic ointment; Place 1 application into the left eye 4 (four) times daily.  Dispense: 3.5 g; Refill: 0 - Ketorolac Tromethamine, PF, 0.45 % SOLN; 1 drop every 6 hours to left eye as needed  Dispense: 30 each; Refill: 0

## 2017-07-07 NOTE — Patient Instructions (Signed)
Corneal Abrasion A corneal abrasion is a scratch or injury to the clear covering over the front of your eye (cornea). Your cornea forms a clear dome that protects your eye and helps to focus your vision. Your cornea is made up of many layers. The surface layer is a single layer of cells (corneal epithelium). It is one of the most sensitive tissues in your body. A corneal abrasion can be very painful. If a corneal abrasion is not treated, it can become infected and cause an ulcer. This can lead to scarring. A scarred cornea can affect your vision. Sometimes abrasions come back in the same area, even after the original injury has healed (recurrent erosion syndrome). What are the causes? This condition may be caused by:  A poke in the eye.  A gritty or irritating substance (foreign body) in the eye.  Excessive eye rubbing.  Very dry eyes.  Certain eye infections.  Contact lenses that fit poorly or are worn for a long period of time. You can also injure your cornea when putting contacts lenses in your eye or taking them out.  Eye surgery.  Sometimes, the cause is unknown. What are the signs or symptoms? Symptoms of this condition include:  Eye pain. The pain may get worse when your eye is open or when you move your eye.  A feeling of something stuck in your eye.  Having trouble keeping your eye open, or not being able to keep it open.  Tearing and redness.  Sensitivity to light.  Blurred vision.  Headache.  How is this diagnosed? This condition may be diagnosed based on:  Your medical history.  Your symptoms.  An eye exam. You may work with a health care provider who specializes in diseases and conditions of the eye (ophthalmologist). Before the eye exam, numbing drops may be put into your eye. You may also have dye put in your eye with a dropper or a small paper strip. The dye makes the abrasion easy to see when your ophthalmologist examines your eye with a light. Your  ophthalmologist may look at your eye through an eye scope (slit lamp).  How is this treated? Treatment may vary depending on the cause of your condition, and it may include:  Washing out your eye.  Removing any foreign body.  Antibiotic drops or ointment to treat an infection.  Steroid drops or ointment to treat redness, irritation, or inflammation.  Pain medicine.  An eye patch to keep your eye closed.  Follow these instructions at home: Medicines  Use eye drops or ointments as told by your eye care provider.  If you were prescribed antibiotic drops or ointment, use them as told by your eye care provider. Do not stop using the antibiotic even if you start to feel better.  Take over-the-counter and prescription medicines only as told by your eye care provider.  Do not drive or use heavy machinery while taking prescription pain medicine. General instructions  If you have an eye patch, wear it as told by your eye care provider. ? Do not drive or use machinery while wearing an eye patch. Your ability to judge distances will be impaired. ? Follow instructions from your eye care provider about when to remove the patch.  Ask your eye care provider whether you can use a cold, wet cloth (compress) on your eye to relieve pain.  Do not rub or touch your eye. Do not wash out your eye.  Do not wear contact lenses until   your eye care provider says that this is okay.  Avoid bright light and eye strain.  Keep all follow-up visits as told by your eye care provider. This is important for preventing infection and vision loss. Contact a health care provider if:  You continue to have eye pain and other symptoms for more than 2 days.  You develop new symptoms, such as redness, tearing, or discharge.  You have discharge that makes your eyelids stick together in the morning.  Your eye patch becomes so loose that you can blink your eye.  Symptoms return after the original abrasion has  healed. Get help right away if:  You have severe eye pain that does not get better with medicine.  You have vision loss. Summary  A corneal abrasion is a scratch on the outer layer of the clear covering over the front of your eye (cornea).  Corneal abrasion can cause eye pain, redness, tearing, and blurred vision.  This condition is usually treated with medicine to prevent infection and scarring. You also may have to wear an eye patch to cover your eye.  Let your eye care provider know if your symptoms continue for more than 2 days. This information is not intended to replace advice given to you by your health care provider. Make sure you discuss any questions you have with your health care provider. Document Released: 10/31/2000 Document Revised: 10/14/2016 Document Reviewed: 10/14/2016 Elsevier Interactive Patient Education  2017 [employer]  

## 2018-01-25 ENCOUNTER — Other Ambulatory Visit: Payer: Self-pay | Admitting: *Deleted

## 2018-01-25 DIAGNOSIS — E785 Hyperlipidemia, unspecified: Secondary | ICD-10-CM

## 2018-01-25 DIAGNOSIS — I10 Essential (primary) hypertension: Secondary | ICD-10-CM

## 2018-01-25 MED ORDER — LOSARTAN POTASSIUM 50 MG PO TABS: 50.0000 mg | ORAL_TABLET | Freq: Every day | ORAL | 1 refills | Status: DC

## 2018-01-25 MED ORDER — ROSUVASTATIN CALCIUM 10 MG PO TABS: 10.0000 mg | ORAL_TABLET | Freq: Every day | ORAL | 3 refills | Status: DC

## 2018-03-19 IMAGING — CR DG ANKLE COMPLETE 3+V*R*
3 series · 3 of 3 positions shown · non-contrast
Comparison: None.

CLINICAL DATA: Initial evaluation for acute injury, swelling.

EXAM:
RIGHT ANKLE - COMPLETE 3+ VIEW

[x ankle ap right]
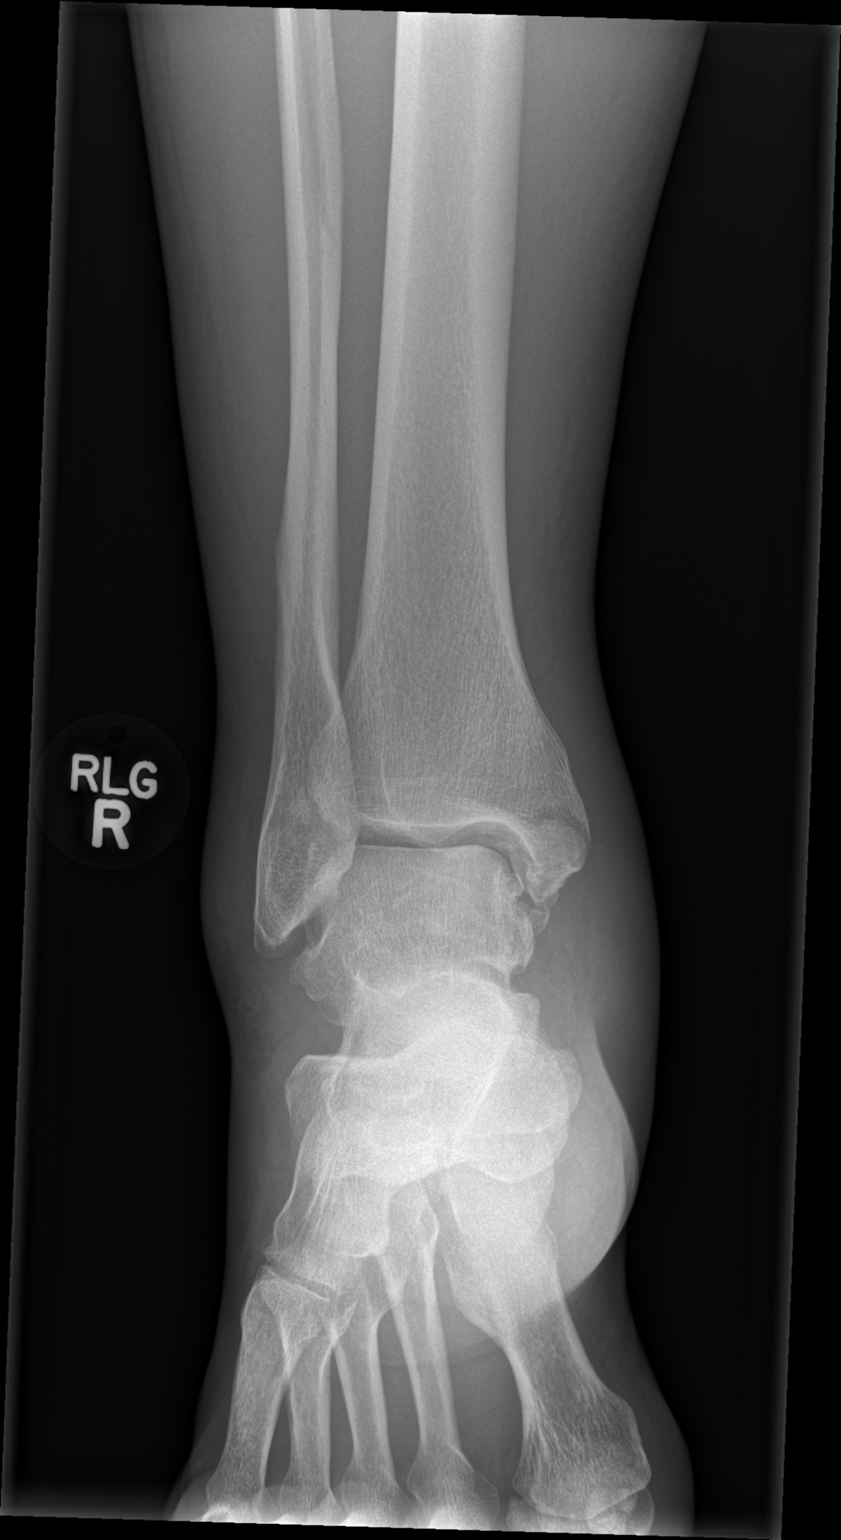

[x ankle obl right]
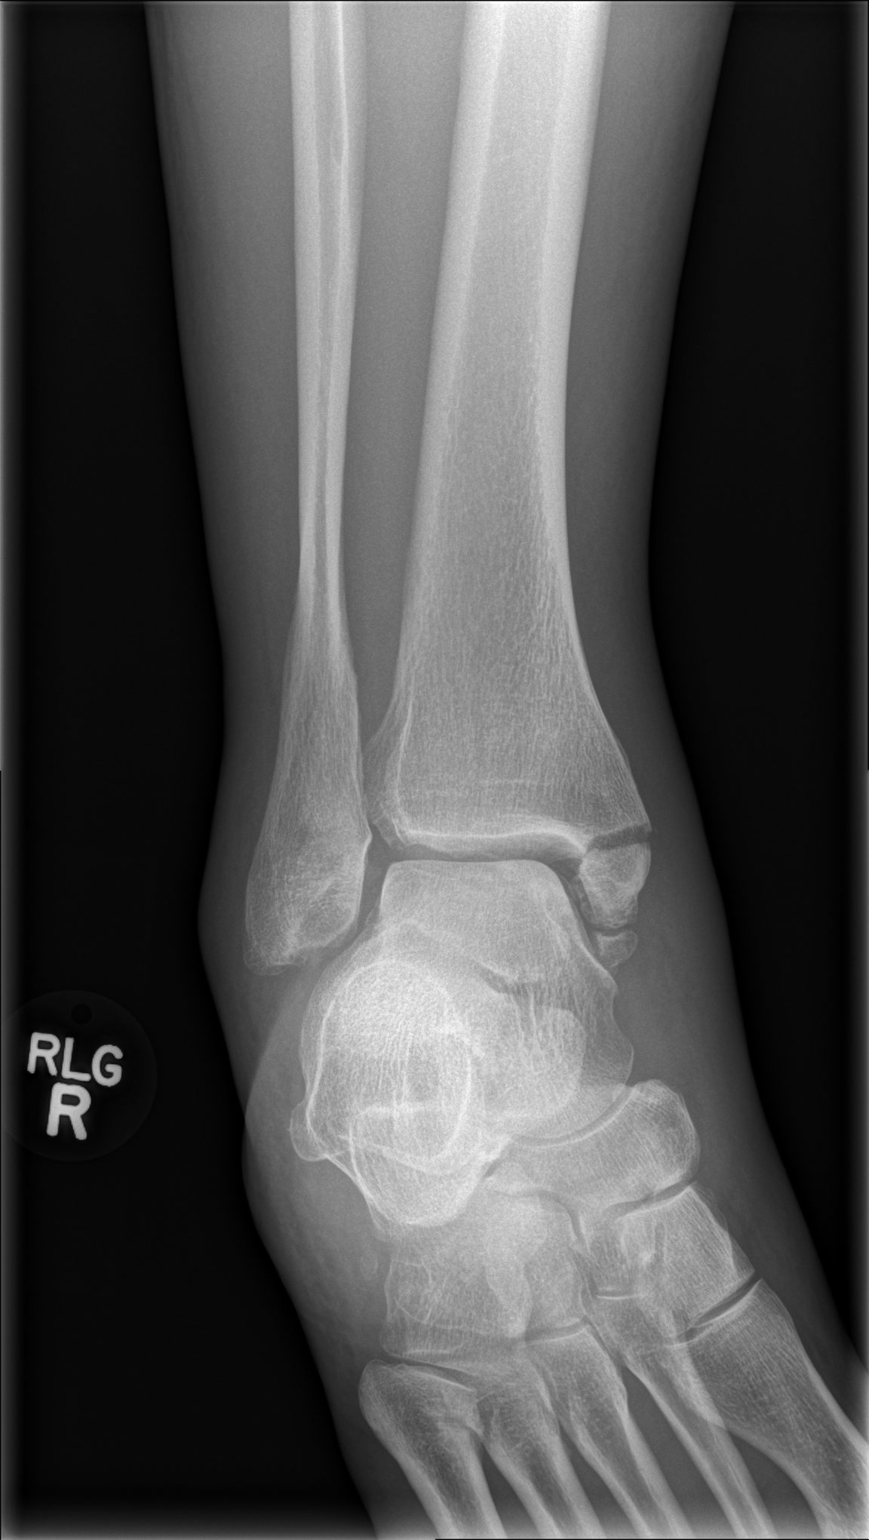

[x ankle lat right]
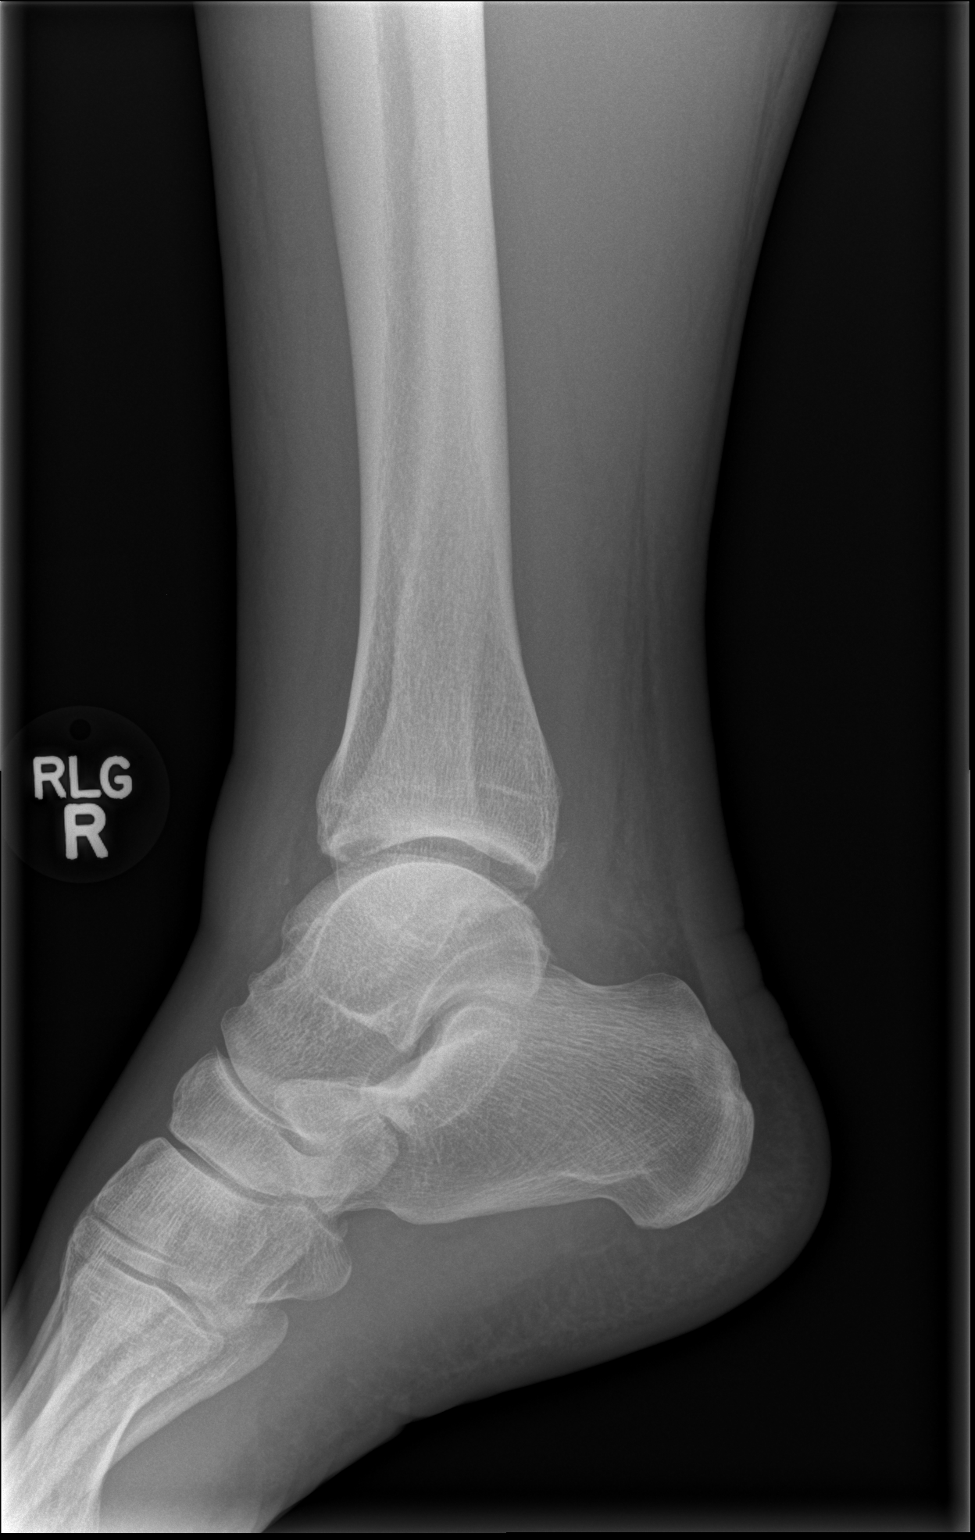

[3 of 3 positions shown; findings below may reference images not displayed]

FINDINGS: There is an acute transverse fracture through the medial malleoli S.
3 mm distraction without significant angulation or displacement.
Osseous fragment just inferior to the fracture malleable is likely a
sequela of remote trauma. Lateral malleoli S and posterior malleable
is intact. Talar dome intact. Ankle mortise approximated. Diffuse
soft tissue swelling about the ankle.
IMPRESSION: 1. Acute transverse fracture through the medial malleolus without
significant displacement.
2. Diffuse soft tissue swelling about the ankle.

## 2018-04-01 ENCOUNTER — Other Ambulatory Visit: Payer: Self-pay | Admitting: Family

## 2018-04-01 NOTE — Telephone Encounter (Signed)
Last Vit D 05/19/17   26.3

## 2018-05-18 ENCOUNTER — Encounter: Payer: Self-pay | Admitting: Family

## 2018-05-18 ENCOUNTER — Ambulatory Visit: Payer: 59 | Admitting: Family

## 2018-05-18 VITALS — BP 131/89 | HR 77 | Temp 96.9°F | Ht 68.0 in | Wt 174.6 lb

## 2018-05-18 DIAGNOSIS — E663 Overweight: Secondary | ICD-10-CM | POA: Insufficient documentation

## 2018-05-18 DIAGNOSIS — Z0000 Encounter for general adult medical examination without abnormal findings: Secondary | ICD-10-CM | POA: Diagnosis not present

## 2018-05-18 DIAGNOSIS — I10 Essential (primary) hypertension: Secondary | ICD-10-CM

## 2018-05-18 DIAGNOSIS — N529 Male erectile dysfunction, unspecified: Secondary | ICD-10-CM

## 2018-05-18 DIAGNOSIS — E785 Hyperlipidemia, unspecified: Secondary | ICD-10-CM

## 2018-05-18 DIAGNOSIS — Z1212 Encounter for screening for malignant neoplasm of rectum: Secondary | ICD-10-CM

## 2018-05-18 DIAGNOSIS — Z1211 Encounter for screening for malignant neoplasm of colon: Secondary | ICD-10-CM

## 2018-05-18 DIAGNOSIS — E559 Vitamin D deficiency, unspecified: Secondary | ICD-10-CM

## 2018-05-18 DIAGNOSIS — B002 Herpesviral gingivostomatitis and pharyngotonsillitis: Secondary | ICD-10-CM | POA: Insufficient documentation

## 2018-05-18 DIAGNOSIS — Z91018 Allergy to other foods: Secondary | ICD-10-CM

## 2018-05-18 MED ORDER — ROSUVASTATIN CALCIUM 10 MG PO TABS: 10.0000 mg | ORAL_TABLET | Freq: Every day | ORAL | 3 refills | Status: DC

## 2018-05-18 MED ORDER — LOSARTAN POTASSIUM 100 MG PO TABS: 100.0000 mg | ORAL_TABLET | Freq: Every day | ORAL | 3 refills | Status: DC

## 2018-05-18 MED ORDER — VALACYCLOVIR HCL 1 G PO TABS: 2000.0000 mg | ORAL_TABLET | Freq: Two times a day (BID) | ORAL | 0 refills | Status: DC

## 2018-05-18 MED ORDER — LOSARTAN POTASSIUM 50 MG PO TABS: 50.0000 mg | ORAL_TABLET | Freq: Every day | ORAL | 1 refills | Status: DC

## 2018-05-18 NOTE — Patient Instructions (Signed)

## 2018-05-18 NOTE — Progress Notes (Signed)
Subjective:    Patient ID: Daniel Mack, male    DOB: 1961/05/19, 57 y.o.   MRN: [id number]  Chief Complaint  Patient presents with  . Hyperlipidemia    medication refill  . Hypertension   PT presents to the office today for CPE. PT reports he is under a great deal of stress at work and has been taking two tablets of losartan.  Hypertension  This is a chronic problem. The current episode started more than 1 year ago. The problem has been resolved since onset. The problem is controlled. Pertinent negatives include no headaches, malaise/fatigue, peripheral edema or shortness of breath. Risk factors for coronary artery disease include dyslipidemia, male gender, obesity and stress. The current treatment provides moderate improvement. There is no history of kidney disease or CAD/MI.  Hyperlipidemia  This is a chronic problem. The current episode started more than 1 year ago. The problem is controlled. Recent lipid tests were reviewed and are normal. Pertinent negatives include no shortness of breath. Current antihyperlipidemic treatment includes statins. The current treatment provides moderate improvement of lipids. Risk factors for coronary artery disease include diabetes mellitus, hypertension, male sex, a sedentary lifestyle and dyslipidemia.  ED PT taking Viagra as needed. Stable. Oral herpes PT taking acyclovir as needed.   Review of Systems  Constitutional: Negative for malaise/fatigue.  Respiratory: Negative for shortness of breath.   Neurological: Negative for headaches.  All other systems reviewed and are negative.      Objective:   Physical Exam  Constitutional: He is oriented to person, place, and time. He appears well-developed and well-nourished. No distress.  HENT:  Head: Normocephalic.  Right Ear: External ear normal.  Left Ear: External ear normal.  Mouth/Throat: Oropharynx is clear and moist.  Eyes: Pupils are equal, round, and reactive to light. Right eye exhibits  no discharge. Left eye exhibits no discharge.  Neck: Normal range of motion. Neck supple. No thyromegaly present.  Cardiovascular: Normal rate, regular rhythm, normal heart sounds and intact distal pulses.  No murmur heard. Pulmonary/Chest: Effort normal and breath sounds normal. No respiratory distress. He has no wheezes.  Abdominal: Soft. Bowel sounds are normal. He exhibits no distension. There is no tenderness.  Musculoskeletal: Normal range of motion. He exhibits no edema or tenderness.  Neurological: He is alert and oriented to person, place, and time. He has normal reflexes. No cranial nerve deficit.  Skin: Skin is warm and dry. No rash noted. No erythema.  Psychiatric: He has a normal mood and affect. His behavior is normal. Judgment and thought content normal.  Vitals reviewed.     BP 131/89   Pulse 77   Temp (!) 96.9 F (36.1 C) (Oral)   Ht 5' 8"  (1.727 m)   Wt 174 lb 9.6 oz (79.2 kg)   BMI 26.55 kg/m      Assessment & Plan:  Daniel Mack comes in today with chief complaint of Hyperlipidemia (medication refill); Hypertension; and Annual Exam   Diagnosis and orders addressed:  1. Annual physical exam - CMP14+EGFR - Lipid panel - CBC with Differential/Platelet - PSA, total and free - Thyroid Panel With TSH - Alpha-Gal Panel - VITAMIN D 25 Hydroxy (Vit-D Deficiency, Fractures)  2. Essential hypertension - CMP14+EGFR - CBC with Differential/Platelet - losartan (COZAAR) 100 MG tablet; Take 1 tablet (100 mg total) by mouth daily.  Dispense: 90 tablet; Refill: 3  3. Hyperlipidemia, unspecified hyperlipidemia type - rosuvastatin (CRESTOR) 10 MG tablet; Take 1 tablet (10 mg total)  by mouth daily.  Dispense: 90 tablet; Refill: 3 - CMP14+EGFR - Lipid panel - CBC with Differential/Platelet  4. Erectile dysfunction, unspecified erectile dysfunction type - CMP14+EGFR - CBC with Differential/Platelet  5. Vitamin D deficiency - CMP14+EGFR - CBC with  Differential/Platelet - VITAMIN D 25 Hydroxy (Vit-D Deficiency, Fractures)  6. Overweight (BMI 25.0-29.9) - CMP14+EGFR - CBC with Differential/Platelet  7. Allergy to meat Do not eat beef, pork, or lamb - CMP14+EGFR - CBC with Differential/Platelet - Alpha-Gal Panel  8. Oral herpes - valACYclovir (VALTREX) 1000 MG tablet; Take 2 tablets (2,000 mg total) by mouth 2 (two) times daily.  Dispense: 4 tablet; Refill: 0  9. Colon cancer screening - Cologuard  10. Screening for malignant neoplasm of the rectum  - Cologuard  Labs pending Health Maintenance reviewed Diet and exercise encouraged  Follow up plan: 6 months   Evelina Dun, FNP

## 2018-05-24 LAB — CMP14+EGFR
A/G RATIO: 2.5 — AB (ref 1.2–2.2)
ALT: 39 IU/L (ref 0–44)
AST: 32 IU/L (ref 0–40)
Albumin: 5 g/dL (ref 3.5–5.5)
Alkaline Phosphatase: 72 IU/L (ref 39–117)
BUN/Creatinine Ratio: 9 (ref 9–20)
BUN: 8 mg/dL (ref 6–24)
Bilirubin Total: 0.4 mg/dL (ref 0.0–1.2)
CO2: 21 mmol/L (ref 20–29)
CREATININE: 0.87 mg/dL (ref 0.76–1.27)
Calcium: 9.3 mg/dL (ref 8.7–10.2)
Chloride: 101 mmol/L (ref 96–106)
GFR, EST AFRICAN AMERICAN: 109 mL/min/{1.73_m2} (ref >59)
GFR, EST NON AFRICAN AMERICAN: 94 mL/min/{1.73_m2} (ref >59)
GLOBULIN, TOTAL: 2 g/dL (ref 1.5–4.5)
Glucose: 114 mg/dL — ABNORMAL HIGH (ref 65–99)
Potassium: 4.3 mmol/L (ref 3.5–5.2)
SODIUM: 137 mmol/L (ref 134–144)
Total Protein: 7 g/dL (ref 6.0–8.5)

## 2018-05-24 LAB — CBC WITH DIFFERENTIAL/PLATELET
BASOS ABS: 0.1 10*3/uL (ref 0.0–0.2)
BASOS: 1 %
EOS (ABSOLUTE): 0 10*3/uL (ref 0.0–0.4)
Eos: 0 %
HEMOGLOBIN: 16.2 g/dL (ref 13.0–17.7)
Hematocrit: 46.6 % (ref 37.5–51.0)
Immature Grans (Abs): 0 10*3/uL (ref 0.0–0.1)
Immature Granulocytes: 0 %
Lymphocytes Absolute: 1.8 10*3/uL (ref 0.7–3.1)
Lymphs: 21 %
MCH: 31.9 pg (ref 26.6–33.0)
MCHC: 34.8 g/dL (ref 31.5–35.7)
MCV: 92 fL (ref 79–97)
MONOS ABS: 0.6 10*3/uL (ref 0.1–0.9)
Monocytes: 7 %
NEUTROS ABS: 6.3 10*3/uL (ref 1.4–7.0)
NEUTROS PCT: 71 %
PLATELETS: 335 10*3/uL (ref 150–450)
RBC: 5.08 x10E6/uL (ref 4.14–5.80)
RDW: 12.9 % (ref 12.3–15.4)
WBC: 8.8 10*3/uL (ref 3.4–10.8)

## 2018-05-24 LAB — THYROID PANEL WITH TSH
Free Thyroxine Index: 1.7 (ref 1.2–4.9)
T3 Uptake Ratio: 30 % (ref 24–39)
T4 TOTAL: 5.8 ug/dL (ref 4.5–12.0)
TSH: 3.7 u[IU]/mL (ref 0.450–4.500)

## 2018-05-24 LAB — ALPHA-GAL PANEL
ALPHA GAL IGE: 26.1 kU/L — AB (ref <0.10)
BEEF CLASS INTERPRETATION: 3
Beef (Bos spp) IgE: 13.1 kU/L — ABNORMAL HIGH (ref <0.35)
Class Interpretation: 2
Class Interpretation: 3
Lamb/Mutton (Ovis spp) IgE: 3.17 kU/L — ABNORMAL HIGH (ref <0.35)
PORK (SUS SPP) IGE: 6.29 kU/L — AB (ref <0.35)

## 2018-05-24 LAB — LIPID PANEL
CHOL/HDL RATIO: 2.6 ratio (ref 0.0–5.0)
Cholesterol, Total: 119 mg/dL (ref 100–199)
HDL: 46 mg/dL (ref >39)
LDL CALC: 51 mg/dL (ref 0–99)
TRIGLYCERIDES: 112 mg/dL (ref 0–149)
VLDL Cholesterol Cal: 22 mg/dL (ref 5–40)

## 2018-05-24 LAB — PSA, TOTAL AND FREE
PROSTATE SPECIFIC AG, SERUM: 1.2 ng/mL (ref 0.0–4.0)
PSA FREE: 0.35 ng/mL
PSA, Free Pct: 29.2 %

## 2018-05-24 LAB — VITAMIN D 25 HYDROXY (VIT D DEFICIENCY, FRACTURES): VIT D 25 HYDROXY: 39.6 ng/mL (ref 30.0–100.0)

## 2018-06-22 ENCOUNTER — Other Ambulatory Visit: Payer: Self-pay | Admitting: Family

## 2018-07-03 ENCOUNTER — Other Ambulatory Visit: Payer: Self-pay | Admitting: Family

## 2018-07-03 DIAGNOSIS — I10 Essential (primary) hypertension: Secondary | ICD-10-CM

## 2019-04-29 ENCOUNTER — Other Ambulatory Visit: Payer: Self-pay | Admitting: Family

## 2019-04-29 DIAGNOSIS — E785 Hyperlipidemia, unspecified: Secondary | ICD-10-CM

## 2019-04-29 NOTE — Telephone Encounter (Signed)
Attempted to contact patient -Na 

## 2019-04-29 NOTE — Telephone Encounter (Signed)
[facility] NTBS LOV 05/18/18

## 2019-05-03 NOTE — Telephone Encounter (Signed)
Left message to please call our office to schedule annual check up.

## 2019-05-25 ENCOUNTER — Other Ambulatory Visit: Payer: Self-pay

## 2019-05-26 ENCOUNTER — Ambulatory Visit: Payer: 59 | Admitting: Family

## 2019-05-26 ENCOUNTER — Encounter: Payer: Self-pay | Admitting: Family

## 2019-05-26 VITALS — BP 119/83 | HR 72 | Temp 97.2°F | Ht 68.0 in | Wt 176.4 lb

## 2019-05-26 DIAGNOSIS — Z0000 Encounter for general adult medical examination without abnormal findings: Secondary | ICD-10-CM | POA: Diagnosis not present

## 2019-05-26 DIAGNOSIS — E559 Vitamin D deficiency, unspecified: Secondary | ICD-10-CM

## 2019-05-26 DIAGNOSIS — I10 Essential (primary) hypertension: Secondary | ICD-10-CM

## 2019-05-26 DIAGNOSIS — Z1211 Encounter for screening for malignant neoplasm of colon: Secondary | ICD-10-CM

## 2019-05-26 DIAGNOSIS — Z91018 Allergy to other foods: Secondary | ICD-10-CM

## 2019-05-26 DIAGNOSIS — E663 Overweight: Secondary | ICD-10-CM

## 2019-05-26 DIAGNOSIS — E785 Hyperlipidemia, unspecified: Secondary | ICD-10-CM

## 2019-05-26 DIAGNOSIS — Z1212 Encounter for screening for malignant neoplasm of rectum: Secondary | ICD-10-CM

## 2019-05-26 MED ORDER — VITAMIN D (ERGOCALCIFEROL) 1.25 MG (50000 UNIT) PO CAPS: 50000.0000 [IU] | ORAL_CAPSULE | ORAL | 3 refills | Status: DC

## 2019-05-26 MED ORDER — ROSUVASTATIN CALCIUM 10 MG PO TABS: 10.0000 mg | ORAL_TABLET | Freq: Every day | ORAL | 3 refills | Status: DC

## 2019-05-26 MED ORDER — LOSARTAN POTASSIUM 100 MG PO TABS: 100.0000 mg | ORAL_TABLET | Freq: Every day | ORAL | 3 refills | Status: DC

## 2019-05-26 NOTE — Patient Instructions (Signed)

## 2019-05-26 NOTE — Progress Notes (Signed)
Subjective:    Patient ID: Daniel Mack, male    DOB: 07/01/1961, 58 y.o.   MRN: [id number]  Chief Complaint  Patient presents with  . Medical Management of Chronic Issues    refills and lab work   Pt presents to the office today for CPE follow up. PT has alpha gal and wants to be retested.  Hypertension This is a chronic problem. The current episode started more than 1 year ago. The problem has been resolved since onset. The problem is controlled. Pertinent negatives include no malaise/fatigue, peripheral edema or shortness of breath. Risk factors for coronary artery disease include dyslipidemia, obesity, male gender and sedentary lifestyle. The current treatment provides moderate improvement. There is no history of kidney disease or CAD/MI.  Hyperlipidemia This is a chronic problem. The current episode started more than 1 year ago. The problem is controlled. Recent lipid tests were reviewed and are normal. Exacerbating diseases include obesity. Pertinent negatives include no shortness of breath. Current antihyperlipidemic treatment includes statins. The current treatment provides moderate improvement of lipids. Risk factors for coronary artery disease include dyslipidemia, male sex and hypertension.      Review of Systems  Constitutional: Negative for malaise/fatigue.  Respiratory: Negative for shortness of breath.   All other systems reviewed and are negative.      Objective:   Physical Exam Vitals signs reviewed.  Constitutional:      General: He is not in acute distress.    Appearance: He is well-developed.  HENT:     Head: Normocephalic.     Right Ear: Tympanic membrane normal.     Left Ear: Tympanic membrane normal.  Eyes:     General:        Right eye: No discharge.        Left eye: No discharge.     Pupils: Pupils are equal, round, and reactive to light.  Neck:     Musculoskeletal: Normal range of motion and neck supple.     Thyroid: No thyromegaly.   Cardiovascular:     Rate and Rhythm: Normal rate and regular rhythm.     Heart sounds: Normal heart sounds. No murmur.  Pulmonary:     Effort: Pulmonary effort is normal. No respiratory distress.     Breath sounds: Normal breath sounds. No wheezing.  Abdominal:     General: Bowel sounds are normal. There is no distension.     Palpations: Abdomen is soft.     Tenderness: There is no abdominal tenderness.  Musculoskeletal: Normal range of motion.        General: No tenderness.  Skin:    General: Skin is warm and dry.     Findings: No erythema or rash.  Neurological:     Mental Status: He is alert and oriented to person, place, and time.     Cranial Nerves: No cranial nerve deficit.     Deep Tendon Reflexes: Reflexes are normal and symmetric.  Psychiatric:        Behavior: Behavior normal.        Thought Content: Thought content normal.        Judgment: Judgment normal.       BP 119/83   Pulse 72   Temp (!) 97.2 F (36.2 C) (Oral)   Ht _0  (1.727 m)   Wt 176 lb 6.4 oz (80 kg)   BMI 26.82 kg/m      Assessment & Plan:  Daniel Mack comes in today with chief  complaint of Medical Management of Chronic Issues (refills and lab work)   Diagnosis and orders addressed:  1. Essential hypertension - CMP14+EGFR - CBC with Differential/Platelet - losartan (COZAAR) 100 MG tablet; Take 1 tablet (100 mg total) by mouth daily.  Dispense: 90 tablet; Refill: 3  2. Overweight (BMI 25.0-29.9) - CMP14+EGFR - CBC with Differential/Platelet  3. Vitamin D deficiency - VITAMIN D 25 Hydroxy (Vit-D Deficiency, Fractures) - CMP14+EGFR - CBC with Differential/Platelet - Vitamin D, Ergocalciferol, (DRISDOL) 1.25 MG (50000 UT) CAPS capsule; Take 1 capsule (50,000 Units total) by mouth every 7 (seven) days.  Dispense: 12 capsule; Refill: 3  4. Hyperlipidemia, unspecified hyperlipidemia type - Lipid panel - CMP14+EGFR - CBC with Differential/Platelet - rosuvastatin (CRESTOR) 10 MG  tablet; Take 1 tablet (10 mg total) by mouth daily.  Dispense: 90 tablet; Refill: 3  5. Allergy to meat - CMP14+EGFR - CBC with Differential/Platelet - Alpha-Gal Panel  6. Annual physical exam - VITAMIN D 25 Hydroxy (Vit-D Deficiency, Fractures) - TSH - PSA, total and free - Lipid panel - CMP14+EGFR - CBC with Differential/Platelet  7. Colon cancer screening - Cologuard  8. Screening for malignant neoplasm of the rectum - Cologuard   Labs pending Health Maintenance reviewed Diet and exercise encouraged  Follow up plan: 6 months    Evelina Dun, FNP

## 2019-06-02 LAB — CBC WITH DIFFERENTIAL/PLATELET
Basophils Absolute: 0.1 10*3/uL (ref 0.0–0.2)
Basos: 1 %
EOS (ABSOLUTE): 0.1 10*3/uL (ref 0.0–0.4)
Eos: 1 %
Hematocrit: 46.1 % (ref 37.5–51.0)
Hemoglobin: 16.3 g/dL (ref 13.0–17.7)
Immature Grans (Abs): 0 10*3/uL (ref 0.0–0.1)
Immature Granulocytes: 0 %
Lymphocytes Absolute: 2.2 10*3/uL (ref 0.7–3.1)
Lymphs: 24 %
MCH: 32.8 pg (ref 26.6–33.0)
MCHC: 35.4 g/dL (ref 31.5–35.7)
MCV: 93 fL (ref 79–97)
Monocytes Absolute: 0.8 10*3/uL (ref 0.1–0.9)
Monocytes: 8 %
Neutrophils Absolute: 6.4 10*3/uL (ref 1.4–7.0)
Neutrophils: 66 %
Platelets: 323 10*3/uL (ref 150–450)
RBC: 4.97 x10E6/uL (ref 4.14–5.80)
RDW: 12.8 % (ref 11.6–15.4)
WBC: 9.5 10*3/uL (ref 3.4–10.8)

## 2019-06-02 LAB — CMP14+EGFR
ALT: 38 IU/L (ref 0–44)
AST: 31 IU/L (ref 0–40)
Albumin/Globulin Ratio: 2.4 — ABNORMAL HIGH (ref 1.2–2.2)
Albumin: 5 g/dL — ABNORMAL HIGH (ref 3.8–4.9)
Alkaline Phosphatase: 65 IU/L (ref 39–117)
BUN/Creatinine Ratio: 10 (ref 10–24)
BUN: 9 mg/dL (ref 8–27)
Bilirubin Total: 0.4 mg/dL (ref 0.0–1.2)
CO2: 25 mmol/L (ref 20–29)
Calcium: 9.4 mg/dL (ref 8.6–10.2)
Chloride: 99 mmol/L (ref 96–106)
Creatinine, Ser: 0.9 mg/dL (ref 0.76–1.27)
GFR calc Af Amer: 107 mL/min/{1.73_m2} (ref >59)
GFR calc non Af Amer: 93 mL/min/{1.73_m2} (ref >59)
Globulin, Total: 2.1 g/dL (ref 1.5–4.5)
Glucose: 102 mg/dL — ABNORMAL HIGH (ref 65–99)
Potassium: 4.5 mmol/L (ref 3.5–5.2)
Sodium: 137 mmol/L (ref 134–144)
Total Protein: 7.1 g/dL (ref 6.0–8.5)

## 2019-06-02 LAB — LIPID PANEL
Chol/HDL Ratio: 2.6 ratio (ref 0.0–5.0)
Cholesterol, Total: 115 mg/dL (ref 100–199)
HDL: 45 mg/dL (ref >39)
LDL Calculated: 51 mg/dL (ref 0–99)
Triglycerides: 93 mg/dL (ref 0–149)
VLDL Cholesterol Cal: 19 mg/dL (ref 5–40)

## 2019-06-02 LAB — VITAMIN D 25 HYDROXY (VIT D DEFICIENCY, FRACTURES): Vit D, 25-Hydroxy: 46.5 ng/mL (ref 30.0–100.0)

## 2019-06-02 LAB — ALPHA-GAL PANEL
Alpha Gal IgE*: 24 kU/L — ABNORMAL HIGH (ref <0.10)
Beef (Bos spp) IgE: 11.9 kU/L — ABNORMAL HIGH (ref <0.35)
Class Interpretation: 2
Class Interpretation: 3
Class Interpretation: 3
Lamb/Mutton (Ovis spp) IgE: 2.07 kU/L — ABNORMAL HIGH (ref <0.35)
Pork (Sus spp) IgE: 5.83 kU/L — ABNORMAL HIGH (ref <0.35)

## 2019-06-02 LAB — PSA, TOTAL AND FREE
PSA, Free Pct: 31.8 %
PSA, Free: 0.35 ng/mL
Prostate Specific Ag, Serum: 1.1 ng/mL (ref 0.0–4.0)

## 2019-06-02 LAB — TSH: TSH: 3.72 u[IU]/mL (ref 0.450–4.500)

## 2019-06-03 ENCOUNTER — Encounter: Payer: Self-pay | Admitting: *Deleted

## 2019-06-05 ENCOUNTER — Other Ambulatory Visit: Payer: Self-pay | Admitting: Family

## 2019-06-05 DIAGNOSIS — I10 Essential (primary) hypertension: Secondary | ICD-10-CM

## 2020-04-06 ENCOUNTER — Other Ambulatory Visit: Payer: Self-pay | Admitting: Family

## 2020-04-06 DIAGNOSIS — E559 Vitamin D deficiency, unspecified: Secondary | ICD-10-CM

## 2020-06-05 ENCOUNTER — Other Ambulatory Visit: Payer: Self-pay | Admitting: Family

## 2020-06-05 DIAGNOSIS — I10 Essential (primary) hypertension: Secondary | ICD-10-CM

## 2020-06-05 DIAGNOSIS — E559 Vitamin D deficiency, unspecified: Secondary | ICD-10-CM

## 2020-06-05 DIAGNOSIS — E785 Hyperlipidemia, unspecified: Secondary | ICD-10-CM

## 2020-06-05 NOTE — Telephone Encounter (Signed)
°  Prescription Request  06/05/2020  What is the name of the medication or equipment? rosuvastatin (CRESTOR) 10 MG tablet, Vitamin D, Ergocalciferol, (DRISDOL) 1.25 MG (50000 UT) CAPS capsule and losartan (COZAAR) 100 MG tablet      Have you contacted your pharmacy to request a refill? (if applicable) no-requesting enough to last until 8/10 appt  Which pharmacy would you like this sent to? [facility] in [place]   Patient notified that their request is being sent to the clinical staff for review and that they should receive a response within 2 business days.

## 2020-06-06 NOTE — Addendum Note (Signed)
Addended by: Julious Payer D on: 06/06/2020 08:53 AM   Modules accepted: Orders

## 2020-06-07 MED ORDER — LOSARTAN POTASSIUM 100 MG PO TABS: 100.0000 mg | ORAL_TABLET | Freq: Every day | ORAL | 0 refills | Status: DC

## 2020-06-07 MED ORDER — ROSUVASTATIN CALCIUM 10 MG PO TABS: 10.0000 mg | ORAL_TABLET | Freq: Every day | ORAL | 0 refills | Status: DC

## 2020-06-25 ENCOUNTER — Other Ambulatory Visit: Payer: Self-pay | Admitting: Family Medicine

## 2020-06-26 ENCOUNTER — Ambulatory Visit: Payer: 59 | Admitting: Family

## 2020-06-26 ENCOUNTER — Encounter: Payer: Self-pay | Admitting: Family

## 2020-06-26 ENCOUNTER — Other Ambulatory Visit: Payer: Self-pay

## 2020-06-26 VITALS — BP 141/87 | HR 79 | Temp 97.8°F | Ht 68.0 in | Wt 182.2 lb

## 2020-06-26 DIAGNOSIS — Z0001 Encounter for general adult medical examination with abnormal findings: Secondary | ICD-10-CM | POA: Diagnosis not present

## 2020-06-26 DIAGNOSIS — E663 Overweight: Secondary | ICD-10-CM

## 2020-06-26 DIAGNOSIS — Z0000 Encounter for general adult medical examination without abnormal findings: Secondary | ICD-10-CM

## 2020-06-26 DIAGNOSIS — E785 Hyperlipidemia, unspecified: Secondary | ICD-10-CM | POA: Diagnosis not present

## 2020-06-26 DIAGNOSIS — N529 Male erectile dysfunction, unspecified: Secondary | ICD-10-CM

## 2020-06-26 DIAGNOSIS — E559 Vitamin D deficiency, unspecified: Secondary | ICD-10-CM

## 2020-06-26 DIAGNOSIS — I10 Essential (primary) hypertension: Secondary | ICD-10-CM

## 2020-06-26 DIAGNOSIS — Z91018 Allergy to other foods: Secondary | ICD-10-CM

## 2020-06-26 DIAGNOSIS — Z1211 Encounter for screening for malignant neoplasm of colon: Secondary | ICD-10-CM

## 2020-06-26 DIAGNOSIS — T753XXA Motion sickness, initial encounter: Secondary | ICD-10-CM

## 2020-06-26 MED ORDER — SILDENAFIL CITRATE 100 MG PO TABS
100.0000 mg | ORAL_TABLET | Freq: Every day | ORAL | 3 refills | Status: DC | PRN
Start: 2020-06-26 — End: 2021-06-27

## 2020-06-26 MED ORDER — VITAMIN D (ERGOCALCIFEROL) 1.25 MG (50000 UNIT) PO CAPS: 50000.0000 [IU] | ORAL_CAPSULE | ORAL | 3 refills | Status: DC

## 2020-06-26 MED ORDER — LOSARTAN POTASSIUM 100 MG PO TABS: 100.0000 mg | ORAL_TABLET | Freq: Every day | ORAL | 4 refills | Status: DC

## 2020-06-26 MED ORDER — SILDENAFIL CITRATE 100 MG PO TABS: 100.0000 mg | ORAL_TABLET | ORAL | 2 refills | Status: DC | PRN

## 2020-06-26 MED ORDER — SCOPOLAMINE 1 MG/3DAYS TD PT72: 1.0000 | MEDICATED_PATCH | TRANSDERMAL | 12 refills | Status: DC

## 2020-06-26 MED ORDER — ROSUVASTATIN CALCIUM 10 MG PO TABS: 10.0000 mg | ORAL_TABLET | Freq: Every day | ORAL | 4 refills | Status: DC

## 2020-06-26 NOTE — Progress Notes (Signed)
Subjective:    Patient ID: Daniel Mack, male    DOB: [birth year], 59 y.o.   MRN: [id number]  Chief Complaint  Patient presents with   Medical Management of Chronic Issues   Pt presents to the office today for CPE follow up. PT has alpha gal and wants to be retested.   He states he needs some "nausea patch", because he is wanting to go out on the boat.  Hypertension This is a chronic problem. The current episode started more than 1 year ago. The problem has been resolved since onset. The problem is controlled. Associated symptoms include malaise/fatigue. Pertinent negatives include no blurred vision, peripheral edema or shortness of breath. Risk factors for coronary artery disease include dyslipidemia, obesity, male gender and sedentary lifestyle. The current treatment provides moderate improvement. There is no history of CAD/MI, CVA or heart failure. There is no history of pheochromocytoma.  Hyperlipidemia This is a chronic problem. The current episode started more than 1 year ago. The problem is controlled. Recent lipid tests were reviewed and are normal. Exacerbating diseases include obesity. Pertinent negatives include no shortness of breath. Current antihyperlipidemic treatment includes statins. The current treatment provides moderate improvement of lipids. Risk factors for coronary artery disease include dyslipidemia, male sex, a sedentary lifestyle and hypertension.  ED Pt uses Viagra as needed. Stable.     Review of Systems  Constitutional: Positive for malaise/fatigue.  Eyes: Negative for blurred vision.  Respiratory: Negative for shortness of breath.   All other systems reviewed and are negative.  Family History  Problem Relation Age of Onset   CAD Mother    CAD Father    Heart attack Father    COPD Paternal Grandfather        lung   Social History   Socioeconomic History   Marital status: Single    Spouse name: Not on file   Number of children: Not on file     Years of education: Not on file   Highest education level: Not on file  Occupational History   Not on file  Tobacco Use   Smoking status: Former Smoker    Types: Cigarettes   Smokeless tobacco: Never Used  Scientific laboratory technician Use: Never used  Substance and Sexual Activity   Alcohol use: No   Drug use: No   Sexual activity: Yes  Other Topics Concern   Not on file  Social History Narrative   Not on file   Social Determinants of Health   Financial Resource Strain:    Difficulty of Paying Living Expenses:   Food Insecurity:    Worried About Charity fundraiser in the Last Year:    Arboriculturist in the Last Year:   Transportation Needs:    Film/video editor (Medical):    Lack of Transportation (Non-Medical):   Physical Activity:    Days of Exercise per Week:    Minutes of Exercise per Session:   Stress:    Feeling of Stress :   Social Connections:    Frequency of Communication with Friends and Family:    Frequency of Social Gatherings with Friends and Family:    Attends Religious Services:    Active Member of Clubs or Organizations:    Attends Archivist Meetings:    Marital Status:        Objective:   Physical Exam Vitals reviewed.  Constitutional:      General: He is not in acute distress.  Appearance: He is well-developed.  HENT:     Head: Normocephalic.     Right Ear: Tympanic membrane normal.     Left Ear: Tympanic membrane normal.  Eyes:     General:        Right eye: No discharge.        Left eye: No discharge.     Pupils: Pupils are equal, round, and reactive to light.  Neck:     Thyroid: No thyromegaly.  Cardiovascular:     Rate and Rhythm: Normal rate and regular rhythm.     Heart sounds: Normal heart sounds. No murmur heard.   Pulmonary:     Effort: Pulmonary effort is normal. No respiratory distress.     Breath sounds: Normal breath sounds. No wheezing.  Abdominal:     General: Bowel sounds are  normal. There is no distension.     Palpations: Abdomen is soft.     Tenderness: There is no abdominal tenderness.  Musculoskeletal:        General: No tenderness. Normal range of motion.     Cervical back: Normal range of motion and neck supple.  Skin:    General: Skin is warm and dry.     Findings: No erythema or rash.  Neurological:     Mental Status: He is alert and oriented to person, place, and time.     Cranial Nerves: No cranial nerve deficit.     Deep Tendon Reflexes: Reflexes are normal and symmetric.  Psychiatric:        Behavior: Behavior normal.        Thought Content: Thought content normal.        Judgment: Judgment normal.       BP (!) 141/87    Pulse 79    Temp 97.8 F (36.6 C) (Temporal)    Ht 5\' 8"  (1.727 m)    Wt 182 lb 3.2 oz (82.6 kg)    SpO2 96%    BMI 27.70 kg/m      Assessment & Plan:  Daniel Mack comes in today with chief complaint of Medical Management of Chronic Issues   Diagnosis and orders addressed:  1. Annual physical exam - CMP14+EGFR - CBC with Differential/Platelet - Lipid panel - TSH - VITAMIN D 25 Hydroxy (Vit-D Deficiency, Fractures) - PSA, total and free - Alpha-Gal Panel  2. Essential hypertension - CMP14+EGFR - CBC with Differential/Platelet - losartan (COZAAR) 100 MG tablet; Take 1 tablet (100 mg total) by mouth daily.  Dispense: 90 tablet; Refill: 4  3. Allergy to meat - CMP14+EGFR - CBC with Differential/Platelet - Alpha-Gal Panel  4. Hyperlipidemia, unspecified hyperlipidemia type - CMP14+EGFR - CBC with Differential/Platelet - rosuvastatin (CRESTOR) 10 MG tablet; Take 1 tablet (10 mg total) by mouth daily.  Dispense: 90 tablet; Refill: 4  5. Overweight (BMI 25.0-29.9) - CMP14+EGFR - CBC with Differential/Platelet  6. Vitamin D deficiency - CMP14+EGFR - CBC with Differential/Platelet - VITAMIN D 25 Hydroxy (Vit-D Deficiency, Fractures) - Vitamin D, Ergocalciferol, (DRISDOL) 1.25 MG (50000 UNIT) CAPS  capsule; Take 1 capsule (50,000 Units total) by mouth every 7 (seven) days.  Dispense: 12 capsule; Refill: 3  7. Colon cancer screening - Cologuard - CMP14+EGFR - CBC with Differential/Platelet  8. Motion sickness, initial encounter - scopolamine (TRANSDERM-SCOP, 1.5 MG,) 1 MG/3DAYS; Place 1 patch (1.5 mg total) onto the skin every 3 (three) days.  Dispense: 10 patch; Refill: 12  9. Erectile dysfunction, unspecified erectile dysfunction type   Labs pending Health  Maintenance reviewed Diet and exercise encouraged  Follow up plan: 1 year   Evelina Dun, FNP

## 2020-06-26 NOTE — Patient Instructions (Signed)
Motion Sickness Motion sickness is an unpleasant, temporary feeling of dizziness and nausea that may occur when you are traveling in a moving vehicle. You may also have abdominal pain, sweating, and paleness. You may experience motion sickness while riding in a boat, car, airplane, or even an amusement park ride. What are the causes? This condition may be caused by overstimulation of part of the inner ear (semicircular canal). The two semicircular canals help you keep your balance by sending signals to your brain about movement. You can get motion sickness if:  The semicircular canals are stimulated too much from the motion of your body.  Your brain gets conflicting signals from the various motion sensors in your body. The effects of motion sickness may be increased by stress, dehydration, other illnesses, or drinking too much alcohol. What increases the risk? This condition is more likely to develop in:  Children who are 61-1 years old.  Women, especially those who are pregnant or taking birth control pills.  People who get migraine headaches.  People who have inner ear disorders.  People who take certain medicines. What are the signs or symptoms? Symptoms of this condition include:  Nausea.  Dizziness.  Vomiting.  Sweating.  Abdominal pain.  Being unsteady when walking.  Paleness. The symptoms of motion sickness usually get better after the motion or traveling stops, but problems may last for hours or days. How is this diagnosed? This condition is diagnosed based on a physical exam, your medical history, and your description of the symptoms that you have while traveling or moving. How is this treated? For most people, symptoms fade quickly after the motion stops. Your health care provider may recommend or prescribe medicine to help prevent motion sickness. Medicine may be in the form of a patch that is placed behind your ear. Avoiding certain things or using certain  techniques before or during travel can help prevent episodes of motion sickness. Follow these instructions at home: Medicines  Take or use over-the-counter and prescription medicines only as told by your health care provider.  If you use a motion sickness patch, wash your hands right after you put the patch on. Touching your hands to your eyes after using the patch can enlarge (dilate) your pupils for 1-2 days and disturb your vision. Eating and drinking   Drink enough fluid to keep your urine pale yellow. Take small, frequent sips of liquids as needed while experiencing motion sickness. Staying hydrated may help relieve or prevent symptoms.  Do not eat large meals before or during travel. When you travel long distances, eat small, bland meals.  Do not drink alcohol before or during travel. When riding in a moving vehicle:  Sit in an area of the vehicle where the least motion occurs. ? On an airplane, sit near the wing. Lie back in your seat if possible. ? On a boat, sit near the middle. ? In a car, sit in the front seat, not the back seat.  Breathe slowly and deeply.  Do not read or focus on nearby objects such as your phone.  Try watching the horizon or a distant object. This is especially helpful when you travel in a boat. In a car, ride in the front seat and look out the front window.  Get some fresh air if possible. For example, open a window when you are riding in a car. General instructions  If possible, avoid situations that cause your motion sickness.  Do not smoke before or during travel.  Avoid areas where people are smoking.  Plan ahead for travel. Talk with your health care provider about whether you should use medicines to help prevent motion sickness. Contact a health care provider if:  Your vomiting or nausea does not go away within 24 hours.  You have blood in your vomit. The blood may be dark red, or it may look like coffee grounds.  You faint.  You have  severe dizziness or light-headedness when you stand up.  You have a fever. Get help right away if:  You have severe pain in your abdomen or chest.  You have trouble breathing.  You have a severe headache.  You develop weakness or numbness on one side of your body.  You have trouble speaking. Summary  Motion sickness is an unpleasant, temporary feeling of dizziness and nausea that occurs when traveling in a moving vehicle.  You may also have abdominal pain, sweating, and paleness.  The symptoms of motion sickness usually get better after the motion or traveling stops, but problems may last for hours or days.  Plan ahead for travel. Talk with your health care provider about whether you should use medicines to help prevent motion sickness. This information is not intended to replace advice given to you by your health care provider. Make sure you discuss any questions you have with your health care provider. Document Revised: 10/16/2017 Document Reviewed: 08/13/2017 Elsevier Patient Education  2020 [employer].

## 2020-07-03 LAB — CMP14+EGFR
ALT: 41 IU/L (ref 0–44)
AST: 31 IU/L (ref 0–40)
Albumin/Globulin Ratio: 2.1 (ref 1.2–2.2)
Albumin: 5 g/dL — ABNORMAL HIGH (ref 3.8–4.8)
Alkaline Phosphatase: 71 IU/L (ref 48–121)
BUN/Creatinine Ratio: 12 (ref 10–24)
BUN: 10 mg/dL (ref 8–27)
Bilirubin Total: 0.6 mg/dL (ref 0.0–1.2)
CO2: 22 mmol/L (ref 20–29)
Calcium: 9.6 mg/dL (ref 8.6–10.2)
Chloride: 100 mmol/L (ref 96–106)
Creatinine, Ser: 0.85 mg/dL (ref 0.76–1.27)
GFR calc Af Amer: 109 mL/min/{1.73_m2} (ref >59)
GFR calc non Af Amer: 94 mL/min/{1.73_m2} (ref >59)
Globulin, Total: 2.4 g/dL (ref 1.5–4.5)
Glucose: 101 mg/dL — ABNORMAL HIGH (ref 65–99)
Potassium: 4.6 mmol/L (ref 3.5–5.2)
Sodium: 137 mmol/L (ref 134–144)
Total Protein: 7.4 g/dL (ref 6.0–8.5)

## 2020-07-03 LAB — ALPHA-GAL PANEL
Alpha Gal IgE*: 23.3 kU/L — ABNORMAL HIGH (ref <0.10)
Beef (Bos spp) IgE: 9.98 kU/L — ABNORMAL HIGH (ref <0.35)
Class Interpretation: 2
Class Interpretation: 3
Class Interpretation: 3
Lamb/Mutton (Ovis spp) IgE: 2.23 kU/L — ABNORMAL HIGH (ref <0.35)
Pork (Sus spp) IgE: 4.84 kU/L — ABNORMAL HIGH (ref <0.35)

## 2020-07-03 LAB — CBC WITH DIFFERENTIAL/PLATELET
Basophils Absolute: 0.1 10*3/uL (ref 0.0–0.2)
Basos: 1 %
EOS (ABSOLUTE): 0.1 10*3/uL (ref 0.0–0.4)
Eos: 1 %
Hematocrit: 49.5 % (ref 37.5–51.0)
Hemoglobin: 17 g/dL (ref 13.0–17.7)
Immature Grans (Abs): 0 10*3/uL (ref 0.0–0.1)
Immature Granulocytes: 0 %
Lymphocytes Absolute: 2.2 10*3/uL (ref 0.7–3.1)
Lymphs: 25 %
MCH: 32 pg (ref 26.6–33.0)
MCHC: 34.3 g/dL (ref 31.5–35.7)
MCV: 93 fL (ref 79–97)
Monocytes Absolute: 0.7 10*3/uL (ref 0.1–0.9)
Monocytes: 8 %
Neutrophils Absolute: 5.7 10*3/uL (ref 1.4–7.0)
Neutrophils: 65 %
Platelets: 309 10*3/uL (ref 150–450)
RBC: 5.32 x10E6/uL (ref 4.14–5.80)
RDW: 12.1 % (ref 11.6–15.4)
WBC: 8.9 10*3/uL (ref 3.4–10.8)

## 2020-07-03 LAB — LIPID PANEL
Chol/HDL Ratio: 3.4 ratio (ref 0.0–5.0)
Cholesterol, Total: 145 mg/dL (ref 100–199)
HDL: 43 mg/dL (ref >39)
LDL Chol Calc (NIH): 77 mg/dL (ref 0–99)
Triglycerides: 141 mg/dL (ref 0–149)
VLDL Cholesterol Cal: 25 mg/dL (ref 5–40)

## 2020-07-03 LAB — TSH: TSH: 3.49 u[IU]/mL (ref 0.450–4.500)

## 2020-07-03 LAB — PSA, TOTAL AND FREE
PSA, Free Pct: 42.9 %
PSA, Free: 0.3 ng/mL
Prostate Specific Ag, Serum: 0.7 ng/mL (ref 0.0–4.0)

## 2020-07-03 LAB — VITAMIN D 25 HYDROXY (VIT D DEFICIENCY, FRACTURES): Vit D, 25-Hydroxy: 32.8 ng/mL (ref 30.0–100.0)

## 2020-07-06 ENCOUNTER — Telehealth: Payer: Self-pay | Admitting: Family

## 2020-07-20 ENCOUNTER — Other Ambulatory Visit: Payer: Self-pay | Admitting: Family

## 2020-08-15 ENCOUNTER — Other Ambulatory Visit: Payer: Self-pay | Admitting: Family

## 2020-09-11 ENCOUNTER — Other Ambulatory Visit: Payer: Self-pay | Admitting: Family

## 2020-11-12 ENCOUNTER — Other Ambulatory Visit: Payer: Self-pay | Admitting: Urology

## 2020-11-14 ENCOUNTER — Other Ambulatory Visit: Payer: Self-pay | Admitting: Family

## 2021-01-10 ENCOUNTER — Other Ambulatory Visit: Payer: Self-pay | Admitting: Family

## 2021-03-06 ENCOUNTER — Other Ambulatory Visit: Payer: Self-pay | Admitting: Urology

## 2021-04-03 ENCOUNTER — Other Ambulatory Visit: Payer: Self-pay | Admitting: Family

## 2021-04-10 ENCOUNTER — Other Ambulatory Visit: Payer: Self-pay | Admitting: Family

## 2021-04-10 DIAGNOSIS — E559 Vitamin D deficiency, unspecified: Secondary | ICD-10-CM

## 2021-04-25 ENCOUNTER — Other Ambulatory Visit: Payer: Self-pay | Admitting: Family

## 2021-04-25 DIAGNOSIS — E559 Vitamin D deficiency, unspecified: Secondary | ICD-10-CM

## 2021-06-24 ENCOUNTER — Other Ambulatory Visit: Payer: Self-pay | Admitting: Family

## 2021-06-27 ENCOUNTER — Encounter: Payer: Self-pay | Admitting: Family

## 2021-06-27 ENCOUNTER — Ambulatory Visit (INDEPENDENT_AMBULATORY_CARE_PROVIDER_SITE_OTHER): Payer: 59 | Admitting: Family

## 2021-06-27 ENCOUNTER — Other Ambulatory Visit: Payer: Self-pay

## 2021-06-27 VITALS — BP 115/76 | HR 74 | Temp 98.6°F | Ht 68.0 in | Wt 175.4 lb

## 2021-06-27 DIAGNOSIS — Z0001 Encounter for general adult medical examination with abnormal findings: Secondary | ICD-10-CM | POA: Diagnosis not present

## 2021-06-27 DIAGNOSIS — E559 Vitamin D deficiency, unspecified: Secondary | ICD-10-CM

## 2021-06-27 DIAGNOSIS — I10 Essential (primary) hypertension: Secondary | ICD-10-CM | POA: Diagnosis not present

## 2021-06-27 DIAGNOSIS — E785 Hyperlipidemia, unspecified: Secondary | ICD-10-CM

## 2021-06-27 DIAGNOSIS — N529 Male erectile dysfunction, unspecified: Secondary | ICD-10-CM

## 2021-06-27 DIAGNOSIS — E663 Overweight: Secondary | ICD-10-CM

## 2021-06-27 DIAGNOSIS — Z91018 Allergy to other foods: Secondary | ICD-10-CM

## 2021-06-27 DIAGNOSIS — Z1211 Encounter for screening for malignant neoplasm of colon: Secondary | ICD-10-CM

## 2021-06-27 DIAGNOSIS — Z0000 Encounter for general adult medical examination without abnormal findings: Secondary | ICD-10-CM

## 2021-06-27 MED ORDER — SILDENAFIL CITRATE 100 MG PO TABS: 100.0000 mg | ORAL_TABLET | Freq: Every day | ORAL | 3 refills | Status: DC | PRN

## 2021-06-27 MED ORDER — LOSARTAN POTASSIUM 100 MG PO TABS: 100.0000 mg | ORAL_TABLET | Freq: Every day | ORAL | 4 refills | Status: DC

## 2021-06-27 MED ORDER — ROSUVASTATIN CALCIUM 10 MG PO TABS: 10.0000 mg | ORAL_TABLET | Freq: Every day | ORAL | 4 refills | Status: DC

## 2021-06-27 MED ORDER — VITAMIN D (ERGOCALCIFEROL) 1.25 MG (50000 UNIT) PO CAPS: 50000.0000 [IU] | ORAL_CAPSULE | ORAL | 0 refills | Status: DC

## 2021-06-27 NOTE — Progress Notes (Signed)
Subjective:    Patient ID: Daniel Mack, male    DOB: [birth year], 60 y.o.   MRN: [id number]  Chief Complaint  Patient presents with   Annual Exam   Pt presents to the office today for CPE and chronic follow up. He reports he had an allergic reaction since our last visit. He accidentally ate beef BBQ.   Hypertension This is a chronic problem. The current episode started more than 1 year ago. The problem has been resolved since onset. The problem is controlled. Pertinent negatives include no malaise/fatigue, peripheral edema or shortness of breath. Risk factors for coronary artery disease include dyslipidemia, obesity and male gender. The current treatment provides moderate improvement.  Hyperlipidemia This is a chronic problem. The current episode started more than 1 year ago. The problem is controlled. Recent lipid tests were reviewed and are normal. Pertinent negatives include no shortness of breath. Current antihyperlipidemic treatment includes statins. The current treatment provides moderate improvement of lipids. Risk factors for coronary artery disease include dyslipidemia, male sex and hypertension.  ED Pt takes Viagra as needed.    Review of Systems  Constitutional:  Negative for malaise/fatigue.  Respiratory:  Negative for shortness of breath.   All other systems reviewed and are negative.  Family History  Problem Relation Age of Onset   CAD Mother    CAD Father    Heart attack Father    COPD Paternal Grandfather        lung   Social History   Socioeconomic History   Marital status: Single    Spouse name: Not on file   Number of children: Not on file   Years of education: Not on file   Highest education level: Not on file  Occupational History   Not on file  Tobacco Use   Smoking status: Former    Types: Cigarettes   Smokeless tobacco: Never  Vaping Use   Vaping Use: Never used  Substance and Sexual Activity   Alcohol use: No   Drug use: No   Sexual  activity: Yes  Other Topics Concern   Not on file  Social History Narrative   Not on file   Social Determinants of Health   Financial Resource Strain: Not on file  Food Insecurity: Not on file  Transportation Needs: Not on file  Physical Activity: Not on file  Stress: Not on file  Social Connections: Not on file       Objective:   Physical Exam Vitals reviewed.  Constitutional:      General: He is not in acute distress.    Appearance: He is well-developed.  HENT:     Head: Normocephalic.     Right Ear: Tympanic membrane normal.     Left Ear: Tympanic membrane normal.  Eyes:     General:        Right eye: No discharge.        Left eye: No discharge.     Pupils: Pupils are equal, round, and reactive to light.  Neck:     Thyroid: No thyromegaly.  Cardiovascular:     Rate and Rhythm: Normal rate and regular rhythm.     Heart sounds: Normal heart sounds. No murmur heard. Pulmonary:     Effort: Pulmonary effort is normal. No respiratory distress.     Breath sounds: Normal breath sounds. No wheezing.  Abdominal:     General: Bowel sounds are normal. There is no distension.     Palpations: Abdomen is soft.  Tenderness: There is no abdominal tenderness.  Musculoskeletal:        General: No tenderness. Normal range of motion.     Cervical back: Normal range of motion and neck supple.  Skin:    General: Skin is warm and dry.     Findings: No erythema or rash.  Neurological:     Mental Status: He is alert and oriented to person, place, and time.     Cranial Nerves: No cranial nerve deficit.     Deep Tendon Reflexes: Reflexes are normal and symmetric.  Psychiatric:        Behavior: Behavior normal.        Thought Content: Thought content normal.        Judgment: Judgment normal.         BP 115/76   Pulse 74   Temp 98.6 F (37 C) (Temporal)   Ht 5' 8"  (1.727 m)   Wt 175 lb 6.4 oz (79.6 kg)   BMI 26.67 kg/m   Assessment & Plan:  Daniel Mack comes in  today with chief complaint of Annual Exam   Diagnosis and orders addressed:  1. Essential hypertension - losartan (COZAAR) 100 MG tablet; Take 1 tablet (100 mg total) by mouth daily.  Dispense: 90 tablet; Refill: 4 - CMP14+EGFR - CBC with Differential/Platelet - Lipid panel  2. Hyperlipidemia, unspecified hyperlipidemia type - rosuvastatin (CRESTOR) 10 MG tablet; Take 1 tablet (10 mg total) by mouth daily.  Dispense: 90 tablet; Refill: 4 - CMP14+EGFR - CBC with Differential/Platelet  3. Vitamin D deficiency - Vitamin D, Ergocalciferol, (DRISDOL) 1.25 MG (50000 UNIT) CAPS capsule; Take 1 capsule (50,000 Units total) by mouth every 7 (seven) days.  Dispense: 12 capsule; Refill: 0 - CMP14+EGFR - CBC with Differential/Platelet - VITAMIN D 25 Hydroxy (Vit-D Deficiency, Fractures)  4. Primary hypertension  - CMP14+EGFR - CBC with Differential/Platelet  5. Overweight (BMI 25.0-29.9) - CMP14+EGFR - CBC with Differential/Platelet  6. Erectile dysfunction, unspecified erectile dysfunction type  - sildenafil (VIAGRA) 100 MG tablet; Take 1 tablet (100 mg total) by mouth daily as needed for erectile dysfunction.  Dispense: 30 tablet; Refill: 3 - CMP14+EGFR - CBC with Differential/Platelet  7. Allergy to meat - CMP14+EGFR - CBC with Differential/Platelet  8. Annual physical exam  - Cologuard - CMP14+EGFR - CBC with Differential/Platelet - Lipid panel - PSA, total and free - TSH - VITAMIN D 25 Hydroxy (Vit-D Deficiency, Fractures)  9. Colon cancer screening - Cologuard - CMP14+EGFR - CBC with Differential/Platelet   Labs pending Health Maintenance reviewed Diet and exercise encouraged  Follow up plan: 1 year    Evelina Dun, FNP

## 2021-06-27 NOTE — Patient Instructions (Signed)
Health Maintenance, Male Adopting a healthy lifestyle and getting preventive care are important in promoting health and wellness. Ask your health care provider about: The right schedule for you to have regular tests and exams. Things you can do on your own to prevent diseases and keep yourself healthy. What should I know about diet, weight, and exercise? Eat a healthy diet  Eat a diet that includes plenty of vegetables, fruits, low-fat dairy products, and lean protein. Do not eat a lot of foods that are high in solid fats, added sugars, or sodium.  Maintain a healthy weight Body mass index (BMI) is a measurement that can be used to identify possible weight problems. It estimates body fat based on height and weight. Your health care provider can help determine your BMI and help you achieve or maintain ahealthy weight. Get regular exercise Get regular exercise. This is one of the most important things you can do for your health. Most adults should: Exercise for at least 150 minutes each week. The exercise should increase your heart rate and make you sweat (moderate-intensity exercise). Do strengthening exercises at least twice a week. This is in addition to the moderate-intensity exercise. Spend less time sitting. Even light physical activity can be beneficial. Watch cholesterol and blood lipids Have your blood tested for lipids and cholesterol at 62 years of age, then havethis test every 5 years. You may need to have your cholesterol levels checked more often if: Your lipid or cholesterol levels are high. You are older than 62 years of age. You are at high risk for heart disease. What should I know about cancer screening? Many types of cancers can be detected early and may often be prevented. Depending on your health history and family history, you may need to have cancer screening at various ages. This may include screening for: Colorectal cancer. Prostate cancer. Skin cancer. Lung  cancer. What should I know about heart disease, diabetes, and high blood pressure? Blood pressure and heart disease High blood pressure causes heart disease and increases the risk of stroke. This is more likely to develop in people who have high blood pressure readings, are of African descent, or are overweight. Talk with your health care provider about your target blood pressure readings. Have your blood pressure checked: Every 3-5 years if you are 18-39 years of age. Every year if you are 40 years old or older. If you are between the ages of 65 and 75 and are a current or former smoker, ask your health care provider if you should have a one-time screening for abdominal aortic aneurysm (AAA). Diabetes Have regular diabetes screenings. This checks your fasting blood sugar level. Have the screening done: Once every three years after age 45 if you are at a normal weight and have a low risk for diabetes. More often and at a younger age if you are overweight or have a high risk for diabetes. What should I know about preventing infection? Hepatitis B If you have a higher risk for hepatitis B, you should be screened for this virus. Talk with your health care provider to find out if you are at risk forhepatitis B infection. Hepatitis C Blood testing is recommended for: Everyone born from 1945 through 1965. Anyone with known risk factors for hepatitis C. Sexually transmitted infections (STIs) You should be screened each year for STIs, including gonorrhea and chlamydia, if: You are sexually active and are younger than 62 years of age. You are older than 62 years of age   and your health care provider tells you that you are at risk for this type of infection. Your sexual activity has changed since you were last screened, and you are at increased risk for chlamydia or gonorrhea. Ask your health care provider if you are at risk. Ask your health care provider about whether you are at high risk for HIV.  Your health care provider may recommend a prescription medicine to help prevent HIV infection. If you choose to take medicine to prevent HIV, you should first get tested for HIV. You should then be tested every 3 months for as long as you are taking the medicine. Follow these instructions at home: Lifestyle Do not use any products that contain nicotine or tobacco, such as cigarettes, e-cigarettes, and chewing tobacco. If you need help quitting, ask your health care provider. Do not use street drugs. Do not share needles. Ask your health care provider for help if you need support or information about quitting drugs. Alcohol use Do not drink alcohol if your health care provider tells you not to drink. If you drink alcohol: Limit how much you have to 0-2 drinks a day. Be aware of how much alcohol is in your drink. In the [place] one drink equals one 12 oz bottle of beer (355 mL), one 5 oz glass of wine (148 mL), or one 1 oz glass of hard liquor (44 mL). General instructions Schedule regular health, dental, and eye exams. Stay current with your vaccines. Tell your health care provider if: You often feel depressed. You have ever been abused or do not feel safe at home. Summary Adopting a healthy lifestyle and getting preventive care are important in promoting health and wellness. Follow your health care provider's instructions about healthy diet, exercising, and getting tested or screened for diseases. Follow your health care provider's instructions on monitoring your cholesterol and blood pressure. This information is not intended to replace advice given to you by your health care provider. Make sure you discuss any questions you have with your healthcare provider. Document Revised: 10/27/2018 Document Reviewed: 10/27/2018 Elsevier Patient Education  2022 [employer]  

## 2021-06-28 ENCOUNTER — Other Ambulatory Visit: Payer: Self-pay | Admitting: Urology

## 2021-06-28 LAB — LIPID PANEL
Chol/HDL Ratio: 3 ratio (ref 0.0–5.0)
Cholesterol, Total: 121 mg/dL (ref 100–199)
HDL: 41 mg/dL (ref >39)
LDL Chol Calc (NIH): 55 mg/dL (ref 0–99)
Triglycerides: 146 mg/dL (ref 0–149)
VLDL Cholesterol Cal: 25 mg/dL (ref 5–40)

## 2021-06-28 LAB — CBC WITH DIFFERENTIAL/PLATELET
Basophils Absolute: 0.1 10*3/uL (ref 0.0–0.2)
Basos: 1 %
EOS (ABSOLUTE): 0.1 10*3/uL (ref 0.0–0.4)
Eos: 1 %
Hematocrit: 47.8 % (ref 37.5–51.0)
Hemoglobin: 16.1 g/dL (ref 13.0–17.7)
Immature Grans (Abs): 0 10*3/uL (ref 0.0–0.1)
Immature Granulocytes: 0 %
Lymphocytes Absolute: 1.9 10*3/uL (ref 0.7–3.1)
Lymphs: 21 %
MCH: 31.2 pg (ref 26.6–33.0)
MCHC: 33.7 g/dL (ref 31.5–35.7)
MCV: 93 fL (ref 79–97)
Monocytes Absolute: 0.6 10*3/uL (ref 0.1–0.9)
Monocytes: 6 %
Neutrophils Absolute: 6.2 10*3/uL (ref 1.4–7.0)
Neutrophils: 71 %
Platelets: 279 10*3/uL (ref 150–450)
RBC: 5.16 x10E6/uL (ref 4.14–5.80)
RDW: 12.3 % (ref 11.6–15.4)
WBC: 8.9 10*3/uL (ref 3.4–10.8)

## 2021-06-28 LAB — CMP14+EGFR
ALT: 34 IU/L (ref 0–44)
AST: 25 IU/L (ref 0–40)
Albumin/Globulin Ratio: 2.6 — ABNORMAL HIGH (ref 1.2–2.2)
Albumin: 4.9 g/dL — ABNORMAL HIGH (ref 3.8–4.8)
Alkaline Phosphatase: 65 IU/L (ref 44–121)
BUN/Creatinine Ratio: 19 (ref 10–24)
BUN: 12 mg/dL (ref 8–27)
Bilirubin Total: 0.4 mg/dL (ref 0.0–1.2)
CO2: 21 mmol/L (ref 20–29)
Calcium: 9.4 mg/dL (ref 8.6–10.2)
Chloride: 95 mmol/L — ABNORMAL LOW (ref 96–106)
Creatinine, Ser: 0.64 mg/dL — ABNORMAL LOW (ref 0.76–1.27)
Globulin, Total: 1.9 g/dL (ref 1.5–4.5)
Glucose: 93 mg/dL (ref 65–99)
Potassium: 4.2 mmol/L (ref 3.5–5.2)
Sodium: 131 mmol/L — ABNORMAL LOW (ref 134–144)
Total Protein: 6.8 g/dL (ref 6.0–8.5)
eGFR: 107 mL/min/{1.73_m2} (ref >59)

## 2021-06-28 LAB — TSH: TSH: 3.32 u[IU]/mL (ref 0.450–4.500)

## 2021-06-28 LAB — PSA, TOTAL AND FREE
PSA, Free Pct: 42.9 %
PSA, Free: 0.3 ng/mL
Prostate Specific Ag, Serum: 0.7 ng/mL (ref 0.0–4.0)

## 2021-06-28 LAB — VITAMIN D 25 HYDROXY (VIT D DEFICIENCY, FRACTURES): Vit D, 25-Hydroxy: 42.1 ng/mL (ref 30.0–100.0)

## 2021-06-29 ENCOUNTER — Other Ambulatory Visit: Payer: Self-pay | Admitting: Family

## 2021-06-29 DIAGNOSIS — N529 Male erectile dysfunction, unspecified: Secondary | ICD-10-CM

## 2021-07-13 ENCOUNTER — Other Ambulatory Visit: Payer: Self-pay | Admitting: Family

## 2021-07-13 DIAGNOSIS — E559 Vitamin D deficiency, unspecified: Secondary | ICD-10-CM

## 2021-07-13 LAB — COLOGUARD: Cologuard: NEGATIVE

## 2021-08-29 ENCOUNTER — Other Ambulatory Visit: Payer: Self-pay | Admitting: Family

## 2021-08-29 DIAGNOSIS — E559 Vitamin D deficiency, unspecified: Secondary | ICD-10-CM

## 2021-10-19 ENCOUNTER — Other Ambulatory Visit: Payer: Self-pay | Admitting: Family

## 2021-10-19 DIAGNOSIS — E559 Vitamin D deficiency, unspecified: Secondary | ICD-10-CM

## 2021-10-26 ENCOUNTER — Other Ambulatory Visit: Payer: Self-pay | Admitting: Urology

## 2021-12-28 ENCOUNTER — Other Ambulatory Visit: Payer: Self-pay | Admitting: Family

## 2022-01-03 ENCOUNTER — Other Ambulatory Visit: Payer: Self-pay | Admitting: Family

## 2022-01-03 DIAGNOSIS — E559 Vitamin D deficiency, unspecified: Secondary | ICD-10-CM

## 2022-01-18 ENCOUNTER — Other Ambulatory Visit: Payer: Self-pay | Admitting: Urology

## 2022-03-25 ENCOUNTER — Other Ambulatory Visit: Payer: Self-pay | Admitting: Family

## 2022-03-25 DIAGNOSIS — E559 Vitamin D deficiency, unspecified: Secondary | ICD-10-CM

## 2022-03-25 NOTE — Telephone Encounter (Signed)
Last OV 06/27/21. Last RF 01/06/22. Next OV none scheduled  ?Vit D 06/27/2021 42.1 ?

## 2022-05-08 ENCOUNTER — Other Ambulatory Visit: Payer: Self-pay | Admitting: Family

## 2022-05-08 DIAGNOSIS — E559 Vitamin D deficiency, unspecified: Secondary | ICD-10-CM

## 2022-07-08 ENCOUNTER — Encounter: Payer: Self-pay | Admitting: Family

## 2022-07-08 ENCOUNTER — Ambulatory Visit (INDEPENDENT_AMBULATORY_CARE_PROVIDER_SITE_OTHER): Payer: 59 | Admitting: Family

## 2022-07-08 VITALS — BP 136/88 | HR 67 | Temp 97.3°F | Ht 68.0 in | Wt 173.8 lb

## 2022-07-08 DIAGNOSIS — Z91018 Allergy to other foods: Secondary | ICD-10-CM

## 2022-07-08 DIAGNOSIS — S80862A Insect bite (nonvenomous), left lower leg, initial encounter: Secondary | ICD-10-CM | POA: Diagnosis not present

## 2022-07-08 DIAGNOSIS — N529 Male erectile dysfunction, unspecified: Secondary | ICD-10-CM | POA: Diagnosis not present

## 2022-07-08 DIAGNOSIS — E785 Hyperlipidemia, unspecified: Secondary | ICD-10-CM

## 2022-07-08 DIAGNOSIS — Z0000 Encounter for general adult medical examination without abnormal findings: Secondary | ICD-10-CM

## 2022-07-08 DIAGNOSIS — I10 Essential (primary) hypertension: Secondary | ICD-10-CM | POA: Diagnosis not present

## 2022-07-08 DIAGNOSIS — E559 Vitamin D deficiency, unspecified: Secondary | ICD-10-CM

## 2022-07-08 DIAGNOSIS — J019 Acute sinusitis, unspecified: Secondary | ICD-10-CM | POA: Diagnosis not present

## 2022-07-08 DIAGNOSIS — Z0001 Encounter for general adult medical examination with abnormal findings: Secondary | ICD-10-CM

## 2022-07-08 DIAGNOSIS — E663 Overweight: Secondary | ICD-10-CM

## 2022-07-08 DIAGNOSIS — W57XXXA Bitten or stung by nonvenomous insect and other nonvenomous arthropods, initial encounter: Secondary | ICD-10-CM

## 2022-07-08 DIAGNOSIS — Z808 Family history of malignant neoplasm of other organs or systems: Secondary | ICD-10-CM

## 2022-07-08 DIAGNOSIS — E349 Endocrine disorder, unspecified: Secondary | ICD-10-CM

## 2022-07-08 MED ORDER — ANDROGEL 20.25 MG/1.25GM (1.62%) TD GEL: TRANSDERMAL | 5 refills | Status: DC

## 2022-07-08 MED ORDER — DOXYCYCLINE HYCLATE 100 MG PO TABS: 100.0000 mg | ORAL_TABLET | Freq: Two times a day (BID) | ORAL | 0 refills | Status: DC

## 2022-07-08 MED ORDER — SILDENAFIL CITRATE 100 MG PO TABS: 100.0000 mg | ORAL_TABLET | Freq: Every day | ORAL | 3 refills | Status: DC | PRN

## 2022-07-08 MED ORDER — ROSUVASTATIN CALCIUM 10 MG PO TABS: 10.0000 mg | ORAL_TABLET | Freq: Every day | ORAL | 4 refills | Status: DC

## 2022-07-08 MED ORDER — LOSARTAN POTASSIUM 100 MG PO TABS: 100.0000 mg | ORAL_TABLET | Freq: Every day | ORAL | 4 refills | Status: DC

## 2022-07-08 MED ORDER — VITAMIN D (ERGOCALCIFEROL) 1.25 MG (50000 UNIT) PO CAPS: 50000.0000 [IU] | ORAL_CAPSULE | ORAL | 0 refills | Status: DC

## 2022-07-08 NOTE — Progress Notes (Signed)
Subjective:    Patient ID: Daniel Mack, male    DOB: [birth year], 61 y.o.   MRN: [id number]  Chief Complaint  Patient presents with   Annual Exam    Tick bite, Wants abx for sinus    Pt presents to the office today for CPE and chronic follow up.   He states he has removed over 10 ticks off of him this year. He reports he removed the last one off his left lower leg. Denies any fever, rash, or joint pain.   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  He has ED and takes Viagra as needed.   He has Testerone deficiency and takes Testerone.     He reports his mother had melanoma with mets to brain.  Hypertension This is a chronic problem. The current episode started more than 1 year ago. The problem has been resolved since onset. The problem is controlled. Pertinent negatives include no headaches, malaise/fatigue, peripheral edema or shortness of breath. Risk factors for coronary artery disease include dyslipidemia, obesity and male gender. The current treatment provides moderate improvement.  Hyperlipidemia This is a chronic problem. The current episode started more than 1 year ago. The problem is controlled. Pertinent negatives include no shortness of breath. Current antihyperlipidemic treatment includes statins. The current treatment provides moderate improvement of lipids. Risk factors for coronary artery disease include dyslipidemia, male sex, hypertension and a sedentary lifestyle.  Sinus Problem This is a new problem. The current episode started 1 to 4 weeks ago. The problem has been gradually worsening since onset. There has been no fever. The pain is moderate. Associated symptoms include congestion, sinus pressure and sneezing. Pertinent negatives include no headaches, shortness of breath or sore throat. Past treatments include acetaminophen and oral decongestants. The treatment provided mild relief.      Review of Systems  Constitutional:  Negative for malaise/fatigue.   HENT:  Positive for congestion, sinus pressure and sneezing. Negative for sore throat.   Respiratory:  Negative for shortness of breath.   Neurological:  Negative for headaches.  All other systems reviewed and are negative.   Family History  Problem Relation Age of Onset   CAD Mother    CAD Father    Heart attack Father    COPD Paternal Grandfather        lung   Social History   Socioeconomic History   Marital status: Single    Spouse name: Not on file   Number of children: Not on file   Years of education: Not on file   Highest education level: Not on file  Occupational History   Not on file  Tobacco Use   Smoking status: Former    Types: Cigarettes   Smokeless tobacco: Never  Vaping Use   Vaping Use: Never used  Substance and Sexual Activity   Alcohol use: No   Drug use: No   Sexual activity: Yes  Other Topics Concern   Not on file  Social History Narrative   Not on file   Social Determinants of Health   Financial Resource Strain: Not on file  Food Insecurity: Not on file  Transportation Needs: Not on file  Physical Activity: Not on file  Stress: Not on file  Social Connections: Not on file       Objective:   Physical Exam Vitals reviewed.  Constitutional:      General: He is not in acute distress.    Appearance: He is well-developed.  HENT:     Head: Normocephalic.     Right Ear: Tympanic membrane normal.     Left Ear: Tympanic membrane normal.  Eyes:     General:        Right eye: No discharge.        Left eye: No discharge.     Pupils: Pupils are equal, round, and reactive to light.  Neck:     Thyroid: No thyromegaly.  Cardiovascular:     Rate and Rhythm: Normal rate and regular rhythm.     Heart sounds: Normal heart sounds. No murmur heard. Pulmonary:     Effort: Pulmonary effort is normal. No respiratory distress.     Breath sounds: Normal breath sounds. No wheezing.  Abdominal:     General: Bowel sounds are normal. There is no  distension.     Palpations: Abdomen is soft.     Tenderness: There is no abdominal tenderness.  Musculoskeletal:        General: No tenderness. Normal range of motion.     Cervical back: Normal range of motion and neck supple.  Skin:    General: Skin is warm and dry.     Findings: No erythema or rash.  Neurological:     Mental Status: He is alert and oriented to person, place, and time.     Cranial Nerves: No cranial nerve deficit.     Deep Tendon Reflexes: Reflexes are normal and symmetric.  Psychiatric:        Behavior: Behavior normal.        Thought Content: Thought content normal.        Judgment: Judgment normal.      Blood pressure 136/88, pulse 67, temperature (!) 97.3 F (36.3 C), temperature source Temporal, height 5' 8"  (1.727 m), weight 173 lb 12.8 oz (78.8 kg), SpO2 97 %.    Assessment & Plan:  Daniel Mack comes in today with chief complaint of Annual Exam (Tick bite, Wants abx for sinus )   Diagnosis and orders addressed:  1. Erectile dysfunction, unspecified erectile dysfunction type - sildenafil (VIAGRA) 100 MG tablet; Take 1 tablet (100 mg total) by mouth daily as needed for erectile dysfunction.  Dispense: 30 tablet; Refill: 3 - CMP14+EGFR - CBC with Differential/Platelet  2. Essential hypertension - losartan (COZAAR) 100 MG tablet; Take 1 tablet (100 mg total) by mouth daily.  Dispense: 90 tablet; Refill: 4 - CMP14+EGFR - CBC with Differential/Platelet  3. Hyperlipidemia, unspecified hyperlipidemia type - rosuvastatin (CRESTOR) 10 MG tablet; Take 1 tablet (10 mg total) by mouth daily.  Dispense: 90 tablet; Refill: 4 - CMP14+EGFR - CBC with Differential/Platelet  4. Vitamin D deficiency - Vitamin D, Ergocalciferol, (DRISDOL) 1.25 MG (50000 UNIT) CAPS capsule; Take 1 capsule (50,000 Units total) by mouth every 7 (seven) days.  Dispense: 12 capsule; Refill: 0 - CMP14+EGFR - CBC with Differential/Platelet - VITAMIN D 25 Hydroxy (Vit-D Deficiency,  Fractures)  5. Family history of melanoma - CMP14+EGFR - CBC with Differential/Platelet  6. Annual physical exam - Ambulatory referral to Dermatology - CMP14+EGFR - CBC with Differential/Platelet - Lipid panel - PSA, total and free - Testosterone,Free and Total - TSH - Alpha-Gal Panel - VITAMIN D 25 Hydroxy (Vit-D Deficiency, Fractures)  7. Primary hypertension - CMP14+EGFR - CBC with Differential/Platelet  8. Allergy to meat - CMP14+EGFR - CBC with Differential/Platelet - Alpha-Gal Panel  9. Overweight (BMI 25.0-29.9) - CMP14+EGFR - CBC with Differential/Platelet  10. Testosterone deficiency - ANDROGEL 20.25 MG/1.25GM (1.62%) GEL;  3 PACKETS PER DAY TOPICALLY  Dispense: 1.25 g; Refill: 5 - CMP14+EGFR - CBC with Differential/Platelet - Testosterone,Free and Total  11. Acute sinusitis, recurrence not specified, unspecified location - Take meds as prescribed - Use a cool mist humidifier  -Use saline nose sprays frequently -Force fluids -For any cough or congestion  Use plain Mucinex- regular strength or max strength is fine -For fever or aces or pains- take tylenol or ibuprofen. -Throat lozenges if help - CMP14+EGFR - CBC with Differential/Platelet - doxycycline (VIBRA-TABS) 100 MG tablet; Take 1 tablet (100 mg total) by mouth 2 (two) times daily.  Dispense: 20 tablet; Refill: 0  12. Tick bite of left lower leg, initial encounter -Pt to report any new fever, joint pain, or rash -Wear protective clothing while outside- Long sleeves and long pants -Put insect repellent on all exposed skin and along clothing -Take a shower as soon as possible after being outside - CMP14+EGFR - CBC with Differential/Platelet - Alpha-Gal Panel - doxycycline (VIBRA-TABS) 100 MG tablet; Take 1 tablet (100 mg total) by mouth 2 (two) times daily.  Dispense: 20 tablet; Refill: 0   Labs pending Health Maintenance reviewed Diet and exercise encouraged  Follow up plan: 6 months     Evelina Dun, FNP

## 2022-07-08 NOTE — Patient Instructions (Signed)
Health Maintenance, Male Adopting a healthy lifestyle and getting preventive care are important in promoting health and wellness. Ask your health care provider about: The right schedule for you to have regular tests and exams. Things you can do on your own to prevent diseases and keep yourself healthy. What should I know about diet, weight, and exercise? Eat a healthy diet  Eat a diet that includes plenty of vegetables, fruits, low-fat dairy products, and lean protein. Do not eat a lot of foods that are high in solid fats, added sugars, or sodium. Maintain a healthy weight Body mass index (BMI) is a measurement that can be used to identify possible weight problems. It estimates body fat based on height and weight. Your health care provider can help determine your BMI and help you achieve or maintain a healthy weight. Get regular exercise Get regular exercise. This is one of the most important things you can do for your health. Most adults should: Exercise for at least 150 minutes each week. The exercise should increase your heart rate and make you sweat (moderate-intensity exercise). Do strengthening exercises at least twice a week. This is in addition to the moderate-intensity exercise. Spend less time sitting. Even light physical activity can be beneficial. Watch cholesterol and blood lipids Have your blood tested for lipids and cholesterol at 63 years of age, then have this test every 5 years. You may need to have your cholesterol levels checked more often if: Your lipid or cholesterol levels are high. You are older than 63 years of age. You are at high risk for heart disease. What should I know about cancer screening? Many types of cancers can be detected early and may often be prevented. Depending on your health history and family history, you may need to have cancer screening at various ages. This may include screening for: Colorectal cancer. Prostate cancer. Skin cancer. Lung  cancer. What should I know about heart disease, diabetes, and high blood pressure? Blood pressure and heart disease High blood pressure causes heart disease and increases the risk of stroke. This is more likely to develop in people who have high blood pressure readings or are overweight. Talk with your health care provider about your target blood pressure readings. Have your blood pressure checked: Every 3-5 years if you are 18-39 years of age. Every year if you are 40 years old or older. If you are between the ages of 65 and 75 and are a current or former smoker, ask your health care provider if you should have a one-time screening for abdominal aortic aneurysm (AAA). Diabetes Have regular diabetes screenings. This checks your fasting blood sugar level. Have the screening done: Once every three years after age 45 if you are at a normal weight and have a low risk for diabetes. More often and at a younger age if you are overweight or have a high risk for diabetes. What should I know about preventing infection? Hepatitis B If you have a higher risk for hepatitis B, you should be screened for this virus. Talk with your health care provider to find out if you are at risk for hepatitis B infection. Hepatitis C Blood testing is recommended for: Everyone born from 1945 through 1965. Anyone with known risk factors for hepatitis C. Sexually transmitted infections (STIs) You should be screened each year for STIs, including gonorrhea and chlamydia, if: You are sexually active and are younger than 63 years of age. You are older than 63 years of age and your   health care provider tells you that you are at risk for this type of infection. Your sexual activity has changed since you were last screened, and you are at increased risk for chlamydia or gonorrhea. Ask your health care provider if you are at risk. Ask your health care provider about whether you are at high risk for HIV. Your health care provider  may recommend a prescription medicine to help prevent HIV infection. If you choose to take medicine to prevent HIV, you should first get tested for HIV. You should then be tested every 3 months for as long as you are taking the medicine. Follow these instructions at home: Alcohol use Do not drink alcohol if your health care provider tells you not to drink. If you drink alcohol: Limit how much you have to 0-2 drinks a day. Know how much alcohol is in your drink. In the [place] one drink equals one 12 oz bottle of beer (355 mL), one 5 oz glass of wine (148 mL), or one 1 oz glass of hard liquor (44 mL). Lifestyle Do not use any products that contain nicotine or tobacco. These products include cigarettes, chewing tobacco, and vaping devices, such as e-cigarettes. If you need help quitting, ask your health care provider. Do not use street drugs. Do not share needles. Ask your health care provider for help if you need support or information about quitting drugs. General instructions Schedule regular health, dental, and eye exams. Stay current with your vaccines. Tell your health care provider if: You often feel depressed. You have ever been abused or do not feel safe at home. Summary Adopting a healthy lifestyle and getting preventive care are important in promoting health and wellness. Follow your health care provider's instructions about healthy diet, exercising, and getting tested or screened for diseases. Follow your health care provider's instructions on monitoring your cholesterol and blood pressure. This information is not intended to replace advice given to you by your health care provider. Make sure you discuss any questions you have with your health care provider. Document Revised: 03/25/2021 Document Reviewed: 03/25/2021 Elsevier Patient Education  2023 Elsevier Inc.  

## 2022-07-15 LAB — CBC WITH DIFFERENTIAL/PLATELET
Basophils Absolute: 0.1 10*3/uL (ref 0.0–0.2)
Basos: 1 %
EOS (ABSOLUTE): 0.1 10*3/uL (ref 0.0–0.4)
Eos: 1 %
Hematocrit: 44.8 % (ref 37.5–51.0)
Hemoglobin: 15.9 g/dL (ref 13.0–17.7)
Immature Grans (Abs): 0 10*3/uL (ref 0.0–0.1)
Immature Granulocytes: 0 %
Lymphocytes Absolute: 1.6 10*3/uL (ref 0.7–3.1)
Lymphs: 21 %
MCH: 32.7 pg (ref 26.6–33.0)
MCHC: 35.5 g/dL (ref 31.5–35.7)
MCV: 92 fL (ref 79–97)
Monocytes Absolute: 0.6 10*3/uL (ref 0.1–0.9)
Monocytes: 8 %
Neutrophils Absolute: 5.4 10*3/uL (ref 1.4–7.0)
Neutrophils: 69 %
Platelets: 274 10*3/uL (ref 150–450)
RBC: 4.86 x10E6/uL (ref 4.14–5.80)
RDW: 11.9 % (ref 11.6–15.4)
WBC: 7.8 10*3/uL (ref 3.4–10.8)

## 2022-07-15 LAB — CMP14+EGFR
ALT: 28 IU/L (ref 0–44)
AST: 26 IU/L (ref 0–40)
Albumin/Globulin Ratio: 2.3 — ABNORMAL HIGH (ref 1.2–2.2)
Albumin: 4.9 g/dL (ref 3.9–4.9)
Alkaline Phosphatase: 63 IU/L (ref 44–121)
BUN/Creatinine Ratio: 11 (ref 10–24)
BUN: 8 mg/dL (ref 8–27)
Bilirubin Total: 0.5 mg/dL (ref 0.0–1.2)
CO2: 21 mmol/L (ref 20–29)
Calcium: 9.3 mg/dL (ref 8.6–10.2)
Chloride: 95 mmol/L — ABNORMAL LOW (ref 96–106)
Creatinine, Ser: 0.75 mg/dL — ABNORMAL LOW (ref 0.76–1.27)
Globulin, Total: 2.1 g/dL (ref 1.5–4.5)
Glucose: 112 mg/dL — ABNORMAL HIGH (ref 70–99)
Potassium: 4.4 mmol/L (ref 3.5–5.2)
Sodium: 132 mmol/L — ABNORMAL LOW (ref 134–144)
Total Protein: 7 g/dL (ref 6.0–8.5)
eGFR: 101 mL/min/{1.73_m2} (ref >59)

## 2022-07-15 LAB — LIPID PANEL
Chol/HDL Ratio: 2.6 ratio (ref 0.0–5.0)
Cholesterol, Total: 123 mg/dL (ref 100–199)
HDL: 47 mg/dL (ref >39)
LDL Chol Calc (NIH): 58 mg/dL (ref 0–99)
Triglycerides: 98 mg/dL (ref 0–149)
VLDL Cholesterol Cal: 18 mg/dL (ref 5–40)

## 2022-07-15 LAB — PSA, TOTAL AND FREE
PSA, Free Pct: 37.5 %
PSA, Free: 0.3 ng/mL
Prostate Specific Ag, Serum: 0.8 ng/mL (ref 0.0–4.0)

## 2022-07-15 LAB — ALPHA-GAL PANEL
Allergen Lamb IgE: 10.3 kU/L — AB
Beef IgE: 15.4 kU/L — AB
IgE (Immunoglobulin E), Serum: 288 IU/mL (ref 6–495)
O215-IgE Alpha-Gal: 42.1 kU/L — AB
Pork IgE: 7.59 kU/L — AB

## 2022-07-15 LAB — VITAMIN D 25 HYDROXY (VIT D DEFICIENCY, FRACTURES): Vit D, 25-Hydroxy: 45.8 ng/mL (ref 30.0–100.0)

## 2022-07-15 LAB — TESTOSTERONE,FREE AND TOTAL
Testosterone, Free: 3.2 pg/mL — ABNORMAL LOW (ref 6.6–18.1)
Testosterone: 351 ng/dL (ref 264–916)

## 2022-07-15 LAB — TSH: TSH: 4.21 u[IU]/mL (ref 0.450–4.500)

## 2022-08-14 ENCOUNTER — Other Ambulatory Visit: Payer: Self-pay | Admitting: Urology

## 2022-09-29 ENCOUNTER — Other Ambulatory Visit: Payer: Self-pay | Admitting: Family

## 2022-09-29 DIAGNOSIS — E559 Vitamin D deficiency, unspecified: Secondary | ICD-10-CM

## 2022-12-20 ENCOUNTER — Other Ambulatory Visit: Payer: Self-pay | Admitting: Family

## 2022-12-22 NOTE — Telephone Encounter (Signed)
Last office 07/08/22 annual physical Last refill 09/29/22, 45 ml, 1 refill

## 2023-01-29 ENCOUNTER — Other Ambulatory Visit: Payer: Self-pay | Admitting: Family

## 2023-01-29 DIAGNOSIS — E559 Vitamin D deficiency, unspecified: Secondary | ICD-10-CM

## 2023-03-25 ENCOUNTER — Other Ambulatory Visit: Payer: Self-pay | Admitting: Family

## 2023-03-25 DIAGNOSIS — E559 Vitamin D deficiency, unspecified: Secondary | ICD-10-CM

## 2023-07-14 ENCOUNTER — Encounter: Payer: Self-pay | Admitting: Family

## 2023-07-14 ENCOUNTER — Ambulatory Visit (INDEPENDENT_AMBULATORY_CARE_PROVIDER_SITE_OTHER): Payer: 59 | Admitting: Family

## 2023-07-14 VITALS — BP 138/83 | HR 72 | Temp 98.7°F | Ht 68.0 in | Wt 174.4 lb

## 2023-07-14 DIAGNOSIS — W57XXXA Bitten or stung by nonvenomous insect and other nonvenomous arthropods, initial encounter: Secondary | ICD-10-CM

## 2023-07-14 DIAGNOSIS — E785 Hyperlipidemia, unspecified: Secondary | ICD-10-CM | POA: Diagnosis not present

## 2023-07-14 DIAGNOSIS — Z0001 Encounter for general adult medical examination with abnormal findings: Secondary | ICD-10-CM

## 2023-07-14 DIAGNOSIS — E663 Overweight: Secondary | ICD-10-CM

## 2023-07-14 DIAGNOSIS — I10 Essential (primary) hypertension: Secondary | ICD-10-CM

## 2023-07-14 DIAGNOSIS — E349 Endocrine disorder, unspecified: Secondary | ICD-10-CM | POA: Diagnosis not present

## 2023-07-14 DIAGNOSIS — Z91018 Allergy to other foods: Secondary | ICD-10-CM

## 2023-07-14 DIAGNOSIS — S80862A Insect bite (nonvenomous), left lower leg, initial encounter: Secondary | ICD-10-CM | POA: Diagnosis not present

## 2023-07-14 DIAGNOSIS — N529 Male erectile dysfunction, unspecified: Secondary | ICD-10-CM

## 2023-07-14 DIAGNOSIS — B370 Candidal stomatitis: Secondary | ICD-10-CM

## 2023-07-14 DIAGNOSIS — E559 Vitamin D deficiency, unspecified: Secondary | ICD-10-CM

## 2023-07-14 DIAGNOSIS — Z0000 Encounter for general adult medical examination without abnormal findings: Secondary | ICD-10-CM

## 2023-07-14 MED ORDER — ROSUVASTATIN CALCIUM 10 MG PO TABS: 10.0000 mg | ORAL_TABLET | Freq: Every day | ORAL | 4 refills | Status: DC

## 2023-07-14 MED ORDER — SILDENAFIL CITRATE 100 MG PO TABS: 100.0000 mg | ORAL_TABLET | Freq: Every day | ORAL | 3 refills | Status: DC | PRN

## 2023-07-14 MED ORDER — MAGIC MOUTHWASH: 5.0000 mL | Freq: Four times a day (QID) | ORAL | 2 refills | Status: DC | PRN

## 2023-07-14 MED ORDER — ANDROGEL 20.25 MG/1.25GM (1.62%) TD GEL: TRANSDERMAL | 5 refills | Status: DC

## 2023-07-14 MED ORDER — DOXYCYCLINE HYCLATE 100 MG PO TABS: 100.0000 mg | ORAL_TABLET | Freq: Two times a day (BID) | ORAL | 0 refills | Status: DC

## 2023-07-14 MED ORDER — VITAMIN D (ERGOCALCIFEROL) 1.25 MG (50000 UNIT) PO CAPS: 50000.0000 [IU] | ORAL_CAPSULE | ORAL | 2 refills | Status: DC

## 2023-07-14 MED ORDER — LOSARTAN POTASSIUM 100 MG PO TABS: 100.0000 mg | ORAL_TABLET | Freq: Every day | ORAL | 4 refills | Status: DC

## 2023-07-14 NOTE — Progress Notes (Signed)
Subjective:    Patient ID: Daniel Mack, male    DOB: [birth year], 63 y.o.   MRN: [id number]  Chief Complaint  Patient presents with   Annual Exam    Tick bite want and antibiotic. Had dental work wants magic mouth wash as well.   Pt presents to the office today for CPE and chronic follow up.    He states he has removed over ticks 30 off of him this year. He reports he removed the last one off his right lower leg. Denies any fever, rash, or joint pain.   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   He has ED and takes Viagra as needed.    He has Testerone deficiency and takes Testerone.      He had a root canal last week and reports some oral thrush. Requesting magic mouthwash.  Hypertension This is a chronic problem. The current episode started more than 1 year ago. The problem has been resolved since onset. The problem is controlled. Pertinent negatives include no malaise/fatigue, peripheral edema or shortness of breath. Risk factors for coronary artery disease include dyslipidemia, male gender and sedentary lifestyle. The current treatment provides moderate improvement.  Hyperlipidemia This is a chronic problem. The current episode started more than 1 year ago. The problem is controlled. Recent lipid tests were reviewed and are normal. Exacerbating diseases include obesity. Pertinent negatives include no shortness of breath. Current antihyperlipidemic treatment includes statins. The current treatment provides moderate improvement of lipids. Risk factors for coronary artery disease include dyslipidemia, diabetes mellitus, male sex, hypertension and a sedentary lifestyle.      Review of Systems  Constitutional:  Negative for malaise/fatigue.  Respiratory:  Negative for shortness of breath.   All other systems reviewed and are negative.  Family History  Problem Relation Age of Onset   CAD Mother    CAD Father    Heart attack Father    COPD Paternal Grandfather        lung    Social History   Socioeconomic History   Marital status: Single    Spouse name: Not on file   Number of children: Not on file   Years of education: Not on file   Highest education level: Not on file  Occupational History   Not on file  Tobacco Use   Smoking status: Former    Types: Cigarettes   Smokeless tobacco: Never  Vaping Use   Vaping status: Never Used  Substance and Sexual Activity   Alcohol use: No   Drug use: No   Sexual activity: Yes  Other Topics Concern   Not on file  Social History Narrative   Not on file   Social Determinants of Health   Financial Resource Strain: Not on file  Food Insecurity: Not on file  Transportation Needs: Not on file  Physical Activity: Not on file  Stress: Not on file  Social Connections: Unknown (01/13/2023)   Received from [facility]   Social Network    Social Network: Not on file       Objective:   Physical Exam Vitals reviewed.  Constitutional:      General: He is not in acute distress.    Appearance: He is well-developed.  HENT:     Head: Normocephalic.     Right Ear: Tympanic membrane normal.     Left Ear: Tympanic membrane normal.  Eyes:     General:  Right eye: No discharge.        Left eye: No discharge.     Pupils: Pupils are equal, round, and reactive to light.  Neck:     Thyroid: No thyromegaly.  Cardiovascular:     Rate and Rhythm: Normal rate and regular rhythm.     Heart sounds: Normal heart sounds. No murmur heard. Pulmonary:     Effort: Pulmonary effort is normal. No respiratory distress.     Breath sounds: Normal breath sounds. No wheezing.  Abdominal:     General: Bowel sounds are normal. There is no distension.     Palpations: Abdomen is soft.     Tenderness: There is no abdominal tenderness.  Musculoskeletal:        General: No tenderness. Normal range of motion.     Cervical back: Normal range of motion and neck supple.  Skin:    General: Skin is warm and dry.      Findings: No erythema or rash.  Neurological:     Mental Status: He is alert and oriented to person, place, and time.     Cranial Nerves: No cranial nerve deficit.     Deep Tendon Reflexes: Reflexes are normal and symmetric.  Psychiatric:        Behavior: Behavior normal.        Thought Content: Thought content normal.        Judgment: Judgment normal.      BP 138/83   Pulse 72   Temp 98.7 F (37.1 C) (Temporal)   Ht 5\' 8"  (1.727 m)   Wt 174 lb 6.4 oz (79.1 kg)   SpO2 97%   BMI 26.52 kg/m      Assessment & Plan:  Daniel Mack comes in today with chief complaint of Annual Exam (Tick bite want and antibiotic. Had dental work wants magic mouth wash as well.)   Diagnosis and orders addressed:  1. Testosterone deficiency - ANDROGEL 20.25 MG/1.25GM (1.62%) GEL; 3 PACKETS PER DAY TOPICALLY  Dispense: 1.25 g; Refill: 5 - CMP14+EGFR - CBC with Differential/Platelet  2. Essential hypertension - losartan (COZAAR) 100 MG tablet; Take 1 tablet (100 mg total) by mouth daily.  Dispense: 90 tablet; Refill: 4 - CMP14+EGFR - CBC with Differential/Platelet  3. Hyperlipidemia, unspecified hyperlipidemia type - rosuvastatin (CRESTOR) 10 MG tablet; Take 1 tablet (10 mg total) by mouth daily.  Dispense: 90 tablet; Refill: 4 - CMP14+EGFR - CBC with Differential/Platelet - Lipid panel  4. Erectile dysfunction, unspecified erectile dysfunction type - sildenafil (VIAGRA) 100 MG tablet; Take 1 tablet (100 mg total) by mouth daily as needed for erectile dysfunction.  Dispense: 30 tablet; Refill: 3 - CMP14+EGFR - CBC with Differential/Platelet  5. Vitamin D deficiency - Vitamin D, Ergocalciferol, (DRISDOL) 1.25 MG (50000 UNIT) CAPS capsule; Take 1 capsule (50,000 Units total) by mouth every 7 (seven) days.  Dispense: 12 capsule; Refill: 2 - CMP14+EGFR - CBC with Differential/Platelet  6. Annual physical exam - CMP14+EGFR - CBC with Differential/Platelet - Lipid panel - PSA, total  and free - TSH - Alpha-Gal Panel  7. Allergy to meat - CMP14+EGFR - CBC with Differential/Platelet - Alpha-Gal Panel  8. Primary hypertension - CMP14+EGFR - CBC with Differential/Platelet  9. Overweight (BMI 25.0-29.9) - CMP14+EGFR - CBC with Differential/Platelet  10. Oral thrush -Start Magic mouthwash - CMP14+EGFR - CBC with Differential/Platelet  11. Tick bite of left lower leg, initial encounter Start doxycyline  -Pt to report any new fever, joint pain, or rash -Wear  protective clothing while outside- Long sleeves and long pants -Put insect repellent on all exposed skin and along clothing -Take a shower as soon as possible after being outside Follow up if symptoms worsen or do not improve  - doxycycline (VIBRA-TABS) 100 MG tablet; Take 1 tablet (100 mg total) by mouth 2 (two) times daily.  Dispense: 20 tablet; Refill: 0 - CMP14+EGFR - CBC with Differential/Platelet   Labs pending Health Maintenance reviewed Diet and exercise encouraged  Follow up plan: 6 months    Jannifer Rodney, FNP

## 2023-07-14 NOTE — Patient Instructions (Signed)
Tick Bite Information, Adult  Ticks are insects that draw blood for food. They climb onto people and animals that brush against the leaves and grasses that they live in. They then bite and attach to the skin. Most ticks are harmless, but some ticks may carry germs that can cause disease. These germs are spread to a person through a bite. To lower your risk of getting a disease from a tick bite, make sure you: Take steps to prevent tick bites. Check for ticks after being outdoors where ticks live. Watch for symptoms of disease if a tick attached to you or if you think a tick bit you. How can I prevent tick bites? Take these steps to help prevent tick bites when you go outdoors in an area where ticks live: Before you go outdoors: Wear long sleeves and long pants to protect your skin from ticks. Wear light-colored clothing so you can see ticks easier. Tuck your pant legs into your socks. Apply insect repellent that has DEET (20% or higher), picaridin, or IR3535 in it to the following areas: Any bare skin. Avoid areas around the eyes and mouth. Edges of clothing, like the top of your boots, the bottom of your pant legs, and your sleeve cuffs. Consider applying an insect repellant that contains permethrin. Follow the instructions on the label. Do not apply permethrin directly to the skin. Instead, apply to the following areas: Clothing and shoes. Outdoor gear and tents. When you are outdoors: Avoid walking through areas with long grass. If you are walking on a trail, stay in the middle of the trail so your skin, hair, and clothing do not touch the bushes. Check for ticks on your clothing, hair, and skin often while you are outdoors. Check again before you go inside. When you go indoors: Check your clothing for ticks. Tumble dry clothes in a dryer on high heat for at least 10 minutes. If clothes are damp, additional time may be needed. If clothes require washing, use hot water. Check your gear and  pets. Shower soon after being outdoors. Check your body for ticks. Do a full body check using a mirror. Be sure to check your scalp, neck, armpits, waist, groin, and joint areas. These are the spots where ticks attach themselves most often. What is the best way to remove a tick?  Remove the tick as soon as possible. Removing it can prevent germs from passing to your body. Do not remove the tick with your bare fingers. Do not try to remove a tick with heat, alcohol, petroleum jelly, or fingernail polish. These things can cause the tick to salivate and regurgitate into your bloodstream, increasing your risk of getting a disease. To remove a tick that is crawling on your skin: Go outside and brush the tick off. Use tape or a lint roller. To remove a tick that is attached to your skin: Wash your hands. If you have gloves, put them on. Use a fine-tipped tweezer, curved forceps, or a tick-removal tool to gently grasp the tick as close to your skin and the tick's head as possible. Gently pull with a steady, upward, and even pressure until the tick lets go. While removing the tick: Take care to keep the tick's head attached to its body. Do not twist or jerk the tick. This can make the tick's head or mouth parts break off and stay in your skin. If this happens, try to remove the mouth parts with tweezers. If you cannot remove them, leave   the area alone and let the skin heal. Do not squeeze or crush the tick's body. This could force disease-carrying fluids from the tick into your body. What should I do after removing a tick? Clean the bite area and your hands with soap and water, rubbing alcohol, or an iodine scrub. If an antiseptic cream or ointment is available, put a small amount on the bite area. Wash and disinfect any tools that you used to remove the tick. How should I dispose of a tick? To dispose of a live tick, use one of these methods: Place it in rubbing alcohol. Place it in a sealed bag  or container, and throw it away. Wrap it tightly in tape, and throw it away. Flush it down the toilet. Where to find more information Centers for Disease Control and Prevention: cdc.gov/ticks [place] Environmental Protection Agency: epa.gov/insect-repellents Contact a health care provider if: You have symptoms of a disease after a tick bite. Symptoms of a tick-borne disease can occur from moments after the tick bites to 30 days after a tick is removed. Symptoms include: Fever or chills. A red rash that makes a circle (bull's-eye rash) in the bite area. Redness and swelling in the bite area. Headache or stiff neck. Muscle, joint, or bone pain. Abnormal tiredness. Numbness in your legs or trouble walking or moving your legs. Tender or swollen lymph glands. Abdominal pain, vomiting, diarrhea, or weight loss. Get help right away if: You are not able to remove a tick. You have muscle weakness or paralysis. Your symptoms get worse or you experience new symptoms. You find an engorged tick on your skin and you are in an area where there is a higher risk of disease from ticks. Summary Ticks may carry germs that can spread to a person through a bite. These germs can cause disease. Wear protective clothing and use insect repellent to prevent tick bites. Follow the instructions on the label. If you find a tick on your body, remove it as soon as possible. If the tick is attached, do not try to remove it with heat, alcohol, petroleum jelly, or fingernail polish. If you have symptoms of a disease after being bitten by a tick, contact a health care provider. This information is not intended to replace advice given to you by your health care provider. Make sure you discuss any questions you have with your health care provider. Document Revised: 02/03/2022 Document Reviewed: 02/03/2022 Elsevier Patient Education  2024 Elsevier Inc.  

## 2023-07-15 ENCOUNTER — Telehealth: Payer: Self-pay | Admitting: *Deleted

## 2023-07-15 DIAGNOSIS — E349 Endocrine disorder, unspecified: Secondary | ICD-10-CM

## 2023-07-15 NOTE — Telephone Encounter (Signed)
Fax from [facility] RE: Testosterone 1.62% (1.25g) pkt Note: alternative requested: Brand name discontinued product Please advise on alternative

## 2023-07-16 MED ORDER — TESTOSTERONE 10 MG/ACT (2%) TD GEL
40.0000 mg | Freq: Every morning | TRANSDERMAL | 2 refills | Status: DC
Start: 2023-07-16 — End: 2024-07-21

## 2023-07-16 NOTE — Addendum Note (Signed)
Addended by: Jannifer Rodney A on: 07/16/2023 01:07 PM   Modules accepted: Orders

## 2023-07-16 NOTE — Telephone Encounter (Signed)
Prescription sent to pharmacy.

## 2023-07-18 LAB — CMP14+EGFR
ALT: 27 IU/L (ref 0–44)
AST: 22 IU/L (ref 0–40)
Albumin: 4.8 g/dL (ref 3.9–4.9)
Alkaline Phosphatase: 73 IU/L (ref 44–121)
BUN/Creatinine Ratio: 21 (ref 10–24)
BUN: 15 mg/dL (ref 8–27)
Bilirubin Total: 0.3 mg/dL (ref 0.0–1.2)
CO2: 21 mmol/L (ref 20–29)
Calcium: 9.5 mg/dL (ref 8.6–10.2)
Chloride: 98 mmol/L (ref 96–106)
Creatinine, Ser: 0.71 mg/dL — ABNORMAL LOW (ref 0.76–1.27)
Globulin, Total: 2.3 g/dL (ref 1.5–4.5)
Glucose: 102 mg/dL — ABNORMAL HIGH (ref 70–99)
Potassium: 4.2 mmol/L (ref 3.5–5.2)
Sodium: 133 mmol/L — ABNORMAL LOW (ref 134–144)
Total Protein: 7.1 g/dL (ref 6.0–8.5)
eGFR: 102 mL/min/{1.73_m2} (ref >59)

## 2023-07-18 LAB — ALPHA-GAL PANEL
Allergen Lamb IgE: 8.79 kU/L — AB
Beef IgE: 15.3 kU/L — AB
IgE (Immunoglobulin E), Serum: 235 [IU]/mL (ref 6–495)
O215-IgE Alpha-Gal: 39.2 kU/L — AB
Pork IgE: 8.45 kU/L — AB

## 2023-07-18 LAB — CBC WITH DIFFERENTIAL/PLATELET
Basophils Absolute: 0 10*3/uL (ref 0.0–0.2)
Basos: 1 %
EOS (ABSOLUTE): 0.1 10*3/uL (ref 0.0–0.4)
Eos: 1 %
Hematocrit: 45.9 % (ref 37.5–51.0)
Hemoglobin: 16 g/dL (ref 13.0–17.7)
Immature Grans (Abs): 0 10*3/uL (ref 0.0–0.1)
Immature Granulocytes: 0 %
Lymphocytes Absolute: 2.2 10*3/uL (ref 0.7–3.1)
Lymphs: 27 %
MCH: 32.3 pg (ref 26.6–33.0)
MCHC: 34.9 g/dL (ref 31.5–35.7)
MCV: 93 fL (ref 79–97)
Monocytes Absolute: 0.6 10*3/uL (ref 0.1–0.9)
Monocytes: 7 %
Neutrophils Absolute: 5.4 10*3/uL (ref 1.4–7.0)
Neutrophils: 64 %
Platelets: 263 10*3/uL (ref 150–450)
RBC: 4.96 x10E6/uL (ref 4.14–5.80)
RDW: 12.3 % (ref 11.6–15.4)
WBC: 8.4 10*3/uL (ref 3.4–10.8)

## 2023-07-18 LAB — LIPID PANEL
Chol/HDL Ratio: 3.6 ratio (ref 0.0–5.0)
Cholesterol, Total: 168 mg/dL (ref 100–199)
HDL: 47 mg/dL (ref >39)
LDL Chol Calc (NIH): 102 mg/dL — ABNORMAL HIGH (ref 0–99)
Triglycerides: 106 mg/dL (ref 0–149)
VLDL Cholesterol Cal: 19 mg/dL (ref 5–40)

## 2023-07-18 LAB — TSH: TSH: 3.76 u[IU]/mL (ref 0.450–4.500)

## 2023-07-18 LAB — PSA, TOTAL AND FREE
PSA, Free Pct: 40 %
PSA, Free: 0.28 ng/mL
Prostate Specific Ag, Serum: 0.7 ng/mL (ref 0.0–4.0)

## 2023-09-28 ENCOUNTER — Other Ambulatory Visit: Payer: Self-pay | Admitting: Urology

## 2023-09-29 ENCOUNTER — Ambulatory Visit (INDEPENDENT_AMBULATORY_CARE_PROVIDER_SITE_OTHER): Payer: Self-pay | Admitting: Audiology

## 2023-09-29 ENCOUNTER — Encounter (INDEPENDENT_AMBULATORY_CARE_PROVIDER_SITE_OTHER): Payer: Self-pay

## 2023-09-29 ENCOUNTER — Ambulatory Visit (INDEPENDENT_AMBULATORY_CARE_PROVIDER_SITE_OTHER): Payer: 59 | Admitting: Audiology

## 2023-09-29 ENCOUNTER — Ambulatory Visit (INDEPENDENT_AMBULATORY_CARE_PROVIDER_SITE_OTHER): Payer: 59 | Admitting: Otolaryngology

## 2023-09-29 VITALS — Ht 68.0 in | Wt 170.0 lb

## 2023-09-29 DIAGNOSIS — H9313 Tinnitus, bilateral: Secondary | ICD-10-CM | POA: Diagnosis not present

## 2023-09-29 DIAGNOSIS — H903 Sensorineural hearing loss, bilateral: Secondary | ICD-10-CM

## 2023-09-29 NOTE — Progress Notes (Signed)
  [address], [address] [place], [place] [address] [phone]  Audiological Evaluation    Name: Daniel Mack     DOB:   July 08, 1961      MRN:   [id number]                                                                                     Service Date: 09/29/2023     Accompanied by: none   Patient comes today after Dr. Suszanne Conners, ENT sent a referral for a hearing evaluation due to concerns with hearing loss.   Symptoms Yes Details  Hearing loss  [x]  Both ears- last test was at Dr. Avel Sensor in 2023  Tinnitus  [x]  Both ears.   Ear pain/ Ear infections  []    Balance problems  []    Noise exposure  [x]  Occupational - farm , machinery and also hunting  Previous ear surgeries  []    Family history  []    Amplification  [x]  Has a set of hearing aids that Dr. Serafina Royals was going to clean.  Other  []  Patient reports he is now using a biteguard as recommended by his dentists.    Otoscopy: Right ear: Clear external ear canals and notable landmarks visualized on the tympanic membrane. Left ear:  Clear external ear canals and notable landmarks visualized on the tympanic membrane.  Tympanometry: Right ear: Type A- Normal external ear canal volume with normal middle ear pressure and tympanic membrane compliance Left ear: Type A- Normal external ear canal volume with normal middle ear pressure and tympanic membrane compliance   Pure tone Audiometry: Right ear- Normal to moderate sensorineural hearing loss from 250 Hz - 8000 Hz. Left ear-  Normal to moderately severe sensorineural hearing loss from 250 Hz - 8000 Hz.  The hearing test results were completed under headphones and results are deemed to be of good reliability. Test technique:  conventional     Speech Audiometry: Right ear- Speech Reception Threshold (SRT) was obtained at 20 dBHL Left ear-Speech Reception Threshold (SRT) was obtained at 20 dBHL   Word Recognition Score Tested using NU-6 (MLV) Right ear: 96% was obtained at a  presentation level of 65 dBHL with contralateral masking which is deemed as  excellent Left ear: 92% was obtained at a presentation level of 65 dBHL with contralateral masking which is deemed as  excellent    Impression: There is not a significant change in pure-tone thresholds when compared to his previous audiogram.    Recommendations: Follow up with ENT as scheduled for today. Return for a hearing evaluation if concerns with hearing changes arise or per MD recommendation. Use hearing protection when exposed to loud/damaging sounds.    Mclane Arora MARIE LEROUX-MARTINEZ, AUD

## 2023-09-29 NOTE — Progress Notes (Signed)
Patient was seen today for a hearing aid check. He currently wears Oticon Zircon 1 hearing aids with size 1, 60 power receivers and 8mm OpenBass domes. Brushed the microphones and replaced the wax filters and domes. Checked for a software update. The listening check revealed good sound quality. He was happy with the changes today and denied needing any programming changes. Instructed him to call if any concerns arise.   Daniel Mack Mancel Lardizabal, AUD, CCC-A 09/29/23

## 2023-10-03 DIAGNOSIS — H9313 Tinnitus, bilateral: Secondary | ICD-10-CM | POA: Insufficient documentation

## 2023-10-03 DIAGNOSIS — H903 Sensorineural hearing loss, bilateral: Secondary | ICD-10-CM | POA: Insufficient documentation

## 2023-10-03 NOTE — Progress Notes (Signed)
Patient ID: Daniel Mack, male   DOB: 1961-10-20, 62 y.o.   MRN: [id number]  Follow-up: Hearing loss, tinnitus  HPI: The patient is a 62 year old male who returns today for his follow-up evaluation.  The patient was previously seen for bilateral high-frequency sensorineural hearing loss and bilateral tinnitus.  He was fitted with bilateral hearing aids.  The patient returns today reporting persistent bilateral tinnitus.  He is hearing better with his hearing aids.  He denies any otalgia, otorrhea, or vertigo.  Exam: General: Communicates without difficulty, well nourished, no acute distress. Head: Normocephalic, no evidence injury, no tenderness, facial buttresses intact without stepoff. Face/sinus: No tenderness to palpation and percussion. Facial movement is normal and symmetric. Eyes: PERRL, EOMI. No scleral icterus, conjunctivae clear. Neuro: CN II exam reveals vision grossly intact.  No nystagmus at any point of gaze. Ears: Auricles well formed without lesions.  Ear canals are intact without mass or lesion.  No erythema or edema is appreciated.  The TMs are intact without fluid. Nose: External evaluation reveals normal support and skin without lesions.  Dorsum is intact.  Anterior rhinoscopy reveals congested mucosa over anterior aspect of inferior turbinates and intact septum.  No purulence noted. Oral:  Oral cavity and oropharynx are intact, symmetric, without erythema or edema.  Mucosa is moist without lesions. Neck: Full range of motion without pain.  There is no significant lymphadenopathy.  No masses palpable.  Thyroid bed within normal limits to palpation.  Parotid glands and submandibular glands equal bilaterally without mass.  Trachea is midline. Neuro:  CN 2-12 grossly intact.   His hearing test shows bilateral high-frequency sensorineural hearing loss.  Assessment: 1.  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 2.  The patient's tinnitus  is likely a direct result of his hearing loss. 3.  His ear canals, tympanic membranes, and middle ear spaces are normal.  Plan: 1.  The physical exam findings and the hearing test results are reviewed with the patient. 2.  The strategies to cope with tinnitus, including the use of masker, hearing aids, tinnitus retraining therapy, and avoidance of caffeine and alcohol are discussed.  3.  Continue the use of his hearing aids. 4.  The patient will return for reevaluation in 1 year.

## 2023-12-16 ENCOUNTER — Emergency Department (HOSPITAL_BASED_OUTPATIENT_CLINIC_OR_DEPARTMENT_OTHER)
Admission: EM | Admit: 2023-12-16 | Discharge: 2023-12-16 | Disposition: A | Discharge status: Home / Self Care | Payer: 59 | Attending: Emergency Medicine | Admitting: Emergency Medicine

## 2023-12-16 ENCOUNTER — Encounter (HOSPITAL_BASED_OUTPATIENT_CLINIC_OR_DEPARTMENT_OTHER): Payer: Self-pay | Admitting: Emergency Medicine

## 2023-12-16 ENCOUNTER — Other Ambulatory Visit: Payer: Self-pay

## 2023-12-16 DIAGNOSIS — J101 Influenza due to other identified influenza virus with other respiratory manifestations: Secondary | ICD-10-CM | POA: Diagnosis not present

## 2023-12-16 DIAGNOSIS — Z20822 Contact with and (suspected) exposure to covid-19: Secondary | ICD-10-CM | POA: Diagnosis not present

## 2023-12-16 DIAGNOSIS — R0981 Nasal congestion: Secondary | ICD-10-CM | POA: Diagnosis present

## 2023-12-16 DIAGNOSIS — R000 Tachycardia, unspecified: Secondary | ICD-10-CM | POA: Diagnosis not present

## 2023-12-16 LAB — RESP PANEL BY RT-PCR (RSV, FLU A&B, COVID)  RVPGX2
Influenza A by PCR: POSITIVE — AB
Influenza B by PCR: NEGATIVE
Resp Syncytial Virus by PCR: NEGATIVE
SARS Coronavirus 2 by RT PCR: NEGATIVE

## 2023-12-16 MED ORDER — OSELTAMIVIR PHOSPHATE 75 MG PO CAPS: 75.0000 mg | ORAL_CAPSULE | Freq: Two times a day (BID) | ORAL | 0 refills | Status: DC

## 2023-12-16 MED ORDER — ACETAMINOPHEN 325 MG PO TABS
650.0000 mg | ORAL_TABLET | Freq: Once | ORAL | Status: AC
  Administered 2023-12-16: 650 mg via ORAL
  Filled 2023-12-16: qty 2

## 2023-12-16 MED ORDER — GUAIFENESIN ER 600 MG PO TB12: 1200.0000 mg | ORAL_TABLET | Freq: Two times a day (BID) | ORAL | 0 refills | Status: AC

## 2023-12-16 MED ORDER — FLUTICASONE PROPIONATE 50 MCG/ACT NA SUSP: 2.0000 | Freq: Every day | NASAL | 2 refills | Status: AC

## 2023-12-16 NOTE — ED Triage Notes (Signed)
C/o nasal congestion and fevers x 2 days. Known flu exposure.

## 2023-12-16 NOTE — ED Provider Notes (Signed)
[place] [facility] AT [facility] Provider Note   CSN: [id number] Arrival date & time: 12/16/23  1820     History  Chief Complaint  Patient presents with   Nasal Congestion    Daniel Mack is a 63 y.o. male presents today for nasal congestion and fever x 2 days.  Patient does have a known flu exposure.  Patient endorses cough, rhinorrhea, chest congestion, nasal congestion, body aches, and headache.  Patient denies shortness of breath, chest pain, weakness, nausea, vomiting, abdominal pain.  HPI     Home Medications Prior to Admission medications   Medication Sig Start Date End Date Taking? Authorizing Provider  fluticasone (FLONASE) 50 MCG/ACT nasal spray Place 2 sprays into both nostrils daily. 12/16/23  Yes Dolphus Jenny, PA-C  guaiFENesin (MUCINEX) 600 MG 12 hr tablet Take 2 tablets (1,200 mg total) by mouth 2 (two) times daily for 15 days. 12/16/23 12/31/23 Yes Dolphus Jenny, PA-C  oseltamivir (TAMIFLU) 75 MG capsule Take 1 capsule (75 mg total) by mouth every 12 (twelve) hours. 12/16/23  Yes Dolphus Jenny, PA-C  ascorbic acid (VITAMIN C) 1000 MG tablet Take by mouth.    [provider]  diphenhydrAMINE (BENADRYL) 25 mg capsule Take 1 capsule (25 mg total) by mouth every 6 (six) hours as needed. 02/04/17   Kirichenko, Lemont Fillers, PA-C  doxycycline (VIBRA-TABS) 100 MG tablet Take 1 tablet (100 mg total) by mouth 2 (two) times daily. 07/14/23   Jannifer Rodney A, FNP  EPINEPHrine (EPIPEN 2-PAK) 0.3 mg/0.3 mL IJ SOAJ injection Inject 0.3 mLs (0.3 mg total) into the muscle once. 05/04/16   Earley Favor, NP  ipratropium (ATROVENT) 0.06 % nasal spray USE 2 SPRAYS IN EACH NOSTRIL 3 TIMES DAILY. 12/22/22   Jannifer Rodney A, FNP  loratadine (CLARITIN) 10 MG tablet Take 10 mg by mouth daily.    [provider]  losartan (COZAAR) 100 MG tablet Take 1 tablet (100 mg total) by mouth daily. 07/14/23   Junie Spencer, FNP  magic mouthwash SOLN Take 5 mLs by  mouth 4 (four) times daily as needed for mouth pain. Patient not taking: Reported on 09/29/2023 07/14/23   Jannifer Rodney A, FNP  RESTASIS 0.05 % ophthalmic emulsion 1 drop 2 (two) times daily. 06/20/22   [provider]  rosuvastatin (CRESTOR) 10 MG tablet Take 1 tablet (10 mg total) by mouth daily. 07/14/23   Junie Spencer, FNP  sildenafil (VIAGRA) 100 MG tablet Take 1 tablet (100 mg total) by mouth daily as needed for erectile dysfunction. 07/14/23   Junie Spencer, FNP  Testosterone 10 MG/ACT (2%) GEL Place 40 mg onto the skin every morning. 07/16/23   Jannifer Rodney A, FNP  Testosterone 40.5 MG/2.5GM (1.62%) GEL APPLY THREE PACKETS TO THE SKIN EVERY MORNING 09/29/23   Bjorn Pippin, MD  Vitamin D, Ergocalciferol, (DRISDOL) 1.25 MG (50000 UNIT) CAPS capsule Take 1 capsule (50,000 Units total) by mouth every 7 (seven) days. 07/14/23   Junie Spencer, FNP      Allergies    Other and Beef-derived drug products    Review of Systems   Review of Systems  Constitutional:  Positive for fever.  HENT:  Positive for congestion and rhinorrhea.   Respiratory:  Positive for cough.   Neurological:  Positive for headaches.    Physical Exam Updated Vital Signs BP 130/84   Pulse (!) 102   Temp (!) 101.6 F (38.7 C)   Resp 18  Ht 5\' 8"  (1.727 m)   Wt 79.4 kg   SpO2 95%   BMI 26.61 kg/m  Physical Exam Vitals and nursing note reviewed.  Constitutional:      General: He is not in acute distress.    Appearance: He is well-developed. He is ill-appearing. He is not toxic-appearing or diaphoretic.  HENT:     Head: Normocephalic and atraumatic.     Right Ear: External ear normal.     Left Ear: External ear normal.     Nose: Congestion and rhinorrhea present.     Mouth/Throat:     Mouth: Mucous membranes are moist.     Pharynx: No oropharyngeal exudate or posterior oropharyngeal erythema.  Eyes:     Extraocular Movements: Extraocular movements intact.     Conjunctiva/sclera:  Conjunctivae normal.     Pupils: Pupils are equal, round, and reactive to light.  Cardiovascular:     Rate and Rhythm: Regular rhythm.     Pulses: Normal pulses.     Heart sounds: Normal heart sounds. No murmur heard.    Comments: Mild tachycardia on exam Pulmonary:     Effort: Pulmonary effort is normal. No respiratory distress.     Breath sounds: Normal breath sounds.  Abdominal:     Palpations: Abdomen is soft.     Tenderness: There is no abdominal tenderness.  Musculoskeletal:        General: No swelling.     Cervical back: Normal range of motion and neck supple. No rigidity.  Skin:    General: Skin is warm and dry.     Capillary Refill: Capillary refill takes less than 2 seconds.  Neurological:     General: No focal deficit present.     Mental Status: He is alert.     Motor: No weakness.  Psychiatric:        Mood and Affect: Mood normal.     ED Results / Procedures / Treatments   Labs (all labs ordered are listed, but only abnormal results are displayed) Labs Reviewed  RESP PANEL BY RT-PCR (RSV, FLU A&B, COVID)  RVPGX2 - Abnormal; Notable for the following components:      Result Value   Influenza A by PCR POSITIVE (*)    All other components within normal limits    EKG None  Radiology No results found.  Procedures Procedures    Medications Ordered in ED Medications  acetaminophen (TYLENOL) tablet 650 mg (650 mg Oral Given 12/16/23 2001)    ED Course/ Medical Decision Making/ A&P                                 Medical Decision Making Risk OTC drugs.   This patient presents to the ED with chief complaint(s) of congestion and fever with pertinent past medical history of none which further complicates the presenting complaint. The complaint involves an extensive differential diagnosis and also carries with it a high risk of complications and morbidity.    The differential diagnosis includes COVID, flu, RSV, URI  Additional history obtained: Records  reviewed Care Everywhere/External Records  ED Course and Reassessment:   Independent labs interpretation:  The following labs were independently interpreted:  Respiratory panel: Influenza A positive  Consultation: - Consulted or discussed management/test interpretation w/ external professional: None  Consideration for admission or further workup: Consider for mission further workup however patient's vital signs, physical exam, and labs were reassuring.  Patient's symptoms  likely due to influenza A of infection.  Patient given outpatient course of Tamiflu, Flonase, and Mucinex.  Patient also advised to use Tylenol Motrin as needed for body aches and fever.  Patient should follow-up with primary care physician if symptoms persist for further evaluation and treatment.        Final Clinical Impression(s) / ED Diagnoses Final diagnoses:  Influenza A    Rx / DC Orders ED Discharge Orders          Ordered    oseltamivir (TAMIFLU) 75 MG capsule  Every 12 hours        12/16/23 2034    fluticasone (FLONASE) 50 MCG/ACT nasal spray  Daily        12/16/23 2034    guaiFENesin (MUCINEX) 600 MG 12 hr tablet  2 times daily        12/16/23 2034              Dolphus Jenny, PA-C 12/16/23 2037    Royanne Foots, DO 12/21/23 [phone]

## 2023-12-16 NOTE — ED Notes (Signed)
Pt. Reports he has had sinus issues and cold symptoms and can't sleep due to he is breathing with his mouth open.

## 2023-12-16 NOTE — Discharge Instructions (Addendum)
Today you are seen for influenza A.  Please pick up your medications and take as prescribed.  You may take Tylenol and Motrin as needed for pain and fever, Flonase for nasal congestion, and plain Mucinex for chest congestion.  Thank you for letting us treat you today. After performing physical exam and reviewing your labs, I feel you are safe to go home. Please follow up with your PCP in the next several days and provide them with your records from this visit. Return to the Emergency Room if pain becomes severe or symptoms worsen.

## 2024-04-24 ENCOUNTER — Other Ambulatory Visit: Payer: Self-pay | Admitting: Urology

## 2024-04-27 ENCOUNTER — Other Ambulatory Visit: Payer: Self-pay | Admitting: Family

## 2024-04-27 DIAGNOSIS — E559 Vitamin D deficiency, unspecified: Secondary | ICD-10-CM

## 2024-06-02 DIAGNOSIS — M79675 Pain in left toe(s): Secondary | ICD-10-CM | POA: Diagnosis not present

## 2024-06-02 DIAGNOSIS — M79672 Pain in left foot: Secondary | ICD-10-CM | POA: Diagnosis not present

## 2024-06-02 DIAGNOSIS — M2012 Hallux valgus (acquired), left foot: Secondary | ICD-10-CM | POA: Diagnosis not present

## 2024-06-02 DIAGNOSIS — M2042 Other hammer toe(s) (acquired), left foot: Secondary | ICD-10-CM | POA: Diagnosis not present

## 2024-07-14 DIAGNOSIS — Z1283 Encounter for screening for malignant neoplasm of skin: Secondary | ICD-10-CM | POA: Diagnosis not present

## 2024-07-14 DIAGNOSIS — D225 Melanocytic nevi of trunk: Secondary | ICD-10-CM | POA: Diagnosis not present

## 2024-07-14 DIAGNOSIS — L821 Other seborrheic keratosis: Secondary | ICD-10-CM | POA: Diagnosis not present

## 2024-07-21 ENCOUNTER — Encounter: Payer: Self-pay | Admitting: Family

## 2024-07-21 ENCOUNTER — Ambulatory Visit (INDEPENDENT_AMBULATORY_CARE_PROVIDER_SITE_OTHER): Admitting: Family

## 2024-07-21 VITALS — BP 123/81 | HR 70 | Temp 98.2°F | Ht 68.0 in | Wt 173.4 lb

## 2024-07-21 DIAGNOSIS — E785 Hyperlipidemia, unspecified: Secondary | ICD-10-CM

## 2024-07-21 DIAGNOSIS — I10 Essential (primary) hypertension: Secondary | ICD-10-CM | POA: Diagnosis not present

## 2024-07-21 DIAGNOSIS — N529 Male erectile dysfunction, unspecified: Secondary | ICD-10-CM

## 2024-07-21 DIAGNOSIS — E349 Endocrine disorder, unspecified: Secondary | ICD-10-CM

## 2024-07-21 DIAGNOSIS — Z91018 Allergy to other foods: Secondary | ICD-10-CM

## 2024-07-21 DIAGNOSIS — E663 Overweight: Secondary | ICD-10-CM

## 2024-07-21 DIAGNOSIS — Z0001 Encounter for general adult medical examination with abnormal findings: Secondary | ICD-10-CM | POA: Diagnosis not present

## 2024-07-21 DIAGNOSIS — E559 Vitamin D deficiency, unspecified: Secondary | ICD-10-CM

## 2024-07-21 DIAGNOSIS — Z91014 Allergy to mammalian meats: Secondary | ICD-10-CM | POA: Diagnosis not present

## 2024-07-21 DIAGNOSIS — Z0000 Encounter for general adult medical examination without abnormal findings: Secondary | ICD-10-CM

## 2024-07-21 LAB — LIPID PANEL

## 2024-07-21 MED ORDER — TESTOSTERONE 10 MG/ACT (2%) TD GEL: 40.0000 mg | Freq: Every morning | TRANSDERMAL | 2 refills | Status: AC

## 2024-07-21 MED ORDER — EPINEPHRINE 0.3 MG/0.3ML IJ SOAJ
0.3000 mg | Freq: Once | INTRAMUSCULAR | 1 refills | Status: AC
Start: 2024-07-21 — End: 2024-07-21

## 2024-07-21 MED ORDER — DIPHENHYDRAMINE HCL 12.5 MG/5ML PO LIQD: 5.0000 mL | Freq: Four times a day (QID) | ORAL | 2 refills | Status: AC | PRN

## 2024-07-21 MED ORDER — ROSUVASTATIN CALCIUM 10 MG PO TABS: 10.0000 mg | ORAL_TABLET | Freq: Every day | ORAL | 4 refills | Status: AC

## 2024-07-21 MED ORDER — LOSARTAN POTASSIUM 100 MG PO TABS: 100.0000 mg | ORAL_TABLET | Freq: Every day | ORAL | 4 refills | Status: AC

## 2024-07-21 NOTE — Progress Notes (Signed)
 Subjective:    Patient ID: Daniel Mack, male    DOB: [birth year], 63 y.o.   MRN: [id number]  Chief Complaint  Patient presents with   Annual Exam   Pt presents to the office today for CPE and chronic follow up.    He states he has removed over ticks 30 off of him this year. He reports he removed the last one off his right lower leg. Denies any fever, rash, or joint pain.   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   He has ED and takes Viagra  as needed.    He has Testerone deficiency and takes Testerone.      He has a dental abscess and currently taking antibiotics. Has a follow up with his dentist for an extraction.   Hypertension This is a chronic problem. The current episode started more than 1 year ago. The problem has been resolved since onset. The problem is controlled. Pertinent negatives include no malaise/fatigue, peripheral edema or shortness of breath. Risk factors for coronary artery disease include dyslipidemia, male gender and sedentary lifestyle. The current treatment provides moderate improvement.  Hyperlipidemia This is a chronic problem. The current episode started more than 1 year ago. The problem is uncontrolled. Recent lipid tests were reviewed and are high. Exacerbating diseases include obesity. Pertinent negatives include no shortness of breath. Current antihyperlipidemic treatment includes statins. The current treatment provides moderate improvement of lipids. Risk factors for coronary artery disease include dyslipidemia, diabetes mellitus, male sex, hypertension and a sedentary lifestyle.      Review of Systems  Constitutional:  Negative for malaise/fatigue.  Respiratory:  Negative for shortness of breath.   All other systems reviewed and are negative.  Family History  Problem Relation Age of Onset   CAD Mother    CAD Father    Heart attack Father    COPD Paternal Grandfather        lung   Social History   Socioeconomic History   Marital  status: Single    Spouse name: Not on file   Number of children: Not on file   Years of education: Not on file   Highest education level: Some college, no degree  Occupational History   Not on file  Tobacco Use   Smoking status: Former    Types: Cigarettes   Smokeless tobacco: Never  Vaping Use   Vaping status: Never Used  Substance and Sexual Activity   Alcohol use: No   Drug use: No   Sexual activity: Yes  Other Topics Concern   Not on file  Social History Narrative   Not on file   Social Drivers of Health   Financial Resource Strain: Low Risk  (07/17/2024)   Overall Financial Resource Strain (CARDIA)    Difficulty of Paying Living Expenses: Not very hard  Food Insecurity: Food Insecurity Present (07/17/2024)   Hunger Vital Sign    Worried About Running Out of Food in the Last Year: Never true    Ran Out of Food in the Last Year: Sometimes true  Transportation Needs: No Transportation Needs (07/17/2024)   PRAPARE - Administrator, Civil Service (Medical): No    Lack of Transportation (Non-Medical): No  Physical Activity: Sufficiently Active (07/17/2024)   Exercise Vital Sign    Days of Exercise per Week: 7 days    Minutes of Exercise per Session: 30 min  Stress: No Stress Concern Present (07/17/2024)   [employer] of Occupational Health -  Occupational Stress Questionnaire    Feeling of Stress: Only a little  Social Connections: Moderately Integrated (07/17/2024)   Social Connection and Isolation Panel    Frequency of Communication with Friends and Family: Twice a week    Frequency of Social Gatherings with Friends and Family: Once a week    Attends Religious Services: More than 4 times per year    Active Member of [employer] or Organizations: Yes    Attends Banker Meetings: 1 to 4 times per year    Marital Status: Widowed       Objective:   Physical Exam Vitals reviewed.  Constitutional:      General: He is not in acute distress.     Appearance: He is well-developed.  HENT:     Head: Normocephalic.     Right Ear: Tympanic membrane normal.     Left Ear: Tympanic membrane normal.  Eyes:     General:        Right eye: No discharge.        Left eye: No discharge.     Pupils: Pupils are equal, round, and reactive to light.  Neck:     Thyroid : No thyromegaly.  Cardiovascular:     Rate and Rhythm: Normal rate and regular rhythm.     Heart sounds: Normal heart sounds. No murmur heard. Pulmonary:     Effort: Pulmonary effort is normal. No respiratory distress.     Breath sounds: Normal breath sounds. No wheezing.  Abdominal:     General: Bowel sounds are normal. There is no distension.     Palpations: Abdomen is soft.     Tenderness: There is no abdominal tenderness.  Musculoskeletal:        General: No tenderness. Normal range of motion.     Cervical back: Normal range of motion and neck supple.  Skin:    General: Skin is warm and dry.     Findings: No erythema or rash.  Neurological:     Mental Status: He is alert and oriented to person, place, and time.     Cranial Nerves: No cranial nerve deficit.     Deep Tendon Reflexes: Reflexes are normal and symmetric.  Psychiatric:        Behavior: Behavior normal.        Thought Content: Thought content normal.        Judgment: Judgment normal.      BP 123/81   Pulse 70   Temp 98.2 F (36.8 C)   Ht 5' 8 (1.727 m)   Wt 173 lb 6.4 oz (78.7 kg)   SpO2 95%   BMI 26.37 kg/m      Assessment & Plan:  Daniel Mack comes in today with chief complaint of Annual Exam   Diagnosis and orders addressed:  1. Hyperlipidemia, unspecified hyperlipidemia type - rosuvastatin  (CRESTOR ) 10 MG tablet; Take 1 tablet (10 mg total) by mouth daily.  Dispense: 90 tablet; Refill: 4 - CMP14+EGFR - CBC with Differential/Platelet  2. Testosterone  deficiency - Testosterone  10 MG/ACT (2%) GEL; Place 40 mg onto the skin every morning.  Dispense: 60 g; Refill: 2 - CMP14+EGFR -  CBC with Differential/Platelet  3. Annual physical exam (Primary) - Cologuard - CMP14+EGFR - CBC with Differential/Platelet - Lipid panel - PSA, total and free - TSH  4. Primary hypertension  - CMP14+EGFR - CBC with Differential/Platelet - TSH  5. Allergy to meat - EPINEPHrine  (EPIPEN  2-PAK) 0.3 mg/0.3 mL IJ SOAJ injection; Inject 0.3  mg into the muscle once for 1 dose.  Dispense: 2 each; Refill: 1 - CMP14+EGFR - CBC with Differential/Platelet  6. Erectile dysfunction, unspecified erectile dysfunction type  - CMP14+EGFR - CBC with Differential/Platelet  7. Vitamin D  deficiency - CMP14+EGFR - CBC with Differential/Platelet - VITAMIN D  25 Hydroxy (Vit-D Deficiency, Fractures)  8. Overweight (BMI 25.0-29.9) - CMP14+EGFR - CBC with Differential/Platelet   Labs pending Continue current medications  Health Maintenance reviewed Diet and exercise encouraged  Follow up plan: 6 months    Bari Learn, FNP

## 2024-07-21 NOTE — Patient Instructions (Signed)
 Dental Abscess  A dental abscess is an infection around a tooth that may involve pain, swelling, and a collection of pus, as well as other symptoms. Treatment is important to help with symptoms and to prevent the infection from spreading. The general types of dental abscesses are: Pulpal abscess. This abscess may form from the inner part of the tooth (pulp). Periodontal abscess. This abscess may form from the gum. What are the causes? This condition is caused by a bacterial infection in or around the tooth. It may result from: Severe tooth decay (cavities). Trauma to the tooth, such as a broken or chipped tooth. What increases the risk? This condition is more likely to develop in males. It is also more likely to develop in people who: Have cavities. Have severe gum disease. Eat sugary snacks between meals. Use tobacco products. Have diabetes. Have a weakened disease-fighting system (immune system). Do not brush and care for their teeth regularly. What are the signs or symptoms? Mild symptoms of this condition include: Tenderness. Bad breath. Fever. A bitter taste in the mouth. Pain in and around the infected tooth. Moderate symptoms of this condition include: Swollen neck glands. Chills. Pus drainage. Swelling and redness around the infected tooth, in the mouth, or in the face. Severe pain in and around the infected tooth. Severe symptoms of this condition include: Difficulty swallowing. Difficulty opening the mouth. Nausea. Vomiting. How is this diagnosed? This condition is diagnosed based on: Your symptoms and your medical and dental history. An examination of the infected tooth. During the exam, your dental care provider may tap on the infected tooth. You may also need to have X-rays taken of the affected area. How is this treated? This condition is treated by getting rid of the infection. This may be done with: Antibiotic medicines. These may be used in certain  situations. Antibacterial mouth rinse. Incision and drainage. This procedure is done by making an incision in the abscess to drain out the pus. Removing pus is the first priority in treating an abscess. A root canal. This may be performed to save the tooth. Your dental care provider accesses the visible part of your tooth (crown) with a drill and removes any infected pulp. Then the space is filled and sealed off. Tooth extraction. The tooth is pulled out if it cannot be saved by other treatment. You may also receive treatment for pain, such as: Acetaminophen or NSAIDs. Gels that contain a numbing medicine. An injection to block the pain near your nerve. Follow these instructions at home: Medicines Take over-the-counter and prescription medicines only as told by your dental care provider. If you were prescribed an antibiotic, take it as told by your dental care provider. Do not stop taking the antibiotic even if you start to feel better. If you were prescribed a gel that contains a numbing medicine, use it exactly as told in the directions. Do not use these gels for children who are younger than 80 years of age. Use an antibacterial mouth rinse as told by your dental care provider. General instructions  Gargle with a mixture of salt and water 3-4 times a day or as needed. To make salt water, completely dissolve -1 tsp (3-6 g) of salt in 1 cup (237 mL) of warm water. Eat a soft diet while your abscess is healing. Drink enough fluid to keep your urine pale yellow. Do not apply heat to the outside of your mouth. Do not use any products that contain nicotine or tobacco. These  products include cigarettes, chewing tobacco, and vaping devices, such as e-cigarettes. If you need help quitting, ask your dental care provider. Keep all follow-up visits. This is important. How is this prevented?  Excellent dental home care, which includes brushing your teeth every morning and night with fluoride  toothpaste. Floss one time each day. Get regularly scheduled dental cleanings. Consider having a dental sealant applied on teeth that have deep grooves to prevent cavities. Drink fluoridated water regularly. This includes most tap water. Check the label on bottled water to see if it contains fluoride. Reduce or eliminate sugary drinks. Eat healthy meals and snacks. Wear a mouth guard or face shield to protect your teeth while playing sports. Contact a health care provider if: Your pain is worse and is not helped by medicine. You have swelling. You see pus around the tooth. You have a fever or chills. Get help right away if: Your symptoms suddenly get worse. You have a very bad headache. You have problems breathing or swallowing. You have trouble opening your mouth. You have swelling in your neck or around your eye. These symptoms may represent a serious problem that is an emergency. Do not wait to see if the symptoms will go away. Get medical help right away. Call your local emergency services (911 in the [place] Do not drive yourself to the hospital. Summary A dental abscess is a collection of pus in or around a tooth that results from an infection. A dental abscess may result from severe tooth decay, trauma to the tooth, or severe gum disease around a tooth. Symptoms include severe pain, swelling, redness, and drainage of pus in and around the infected tooth. The first priority in treating a dental abscess is to drain out the pus. Treatment may also involve removing damage inside the tooth (root canal) or extracting the tooth. This information is not intended to replace advice given to you by your health care provider. Make sure you discuss any questions you have with your health care provider. Document Revised: 01/10/2021 Document Reviewed: 01/10/2021 Elsevier Patient Education  2024 [employer].

## 2024-07-22 ENCOUNTER — Ambulatory Visit: Payer: Self-pay | Admitting: Family

## 2024-07-22 LAB — CMP14+EGFR
ALT: 33 IU/L (ref 0–44)
AST: 23 IU/L (ref 0–40)
Albumin: 4.6 g/dL (ref 3.9–4.9)
Alkaline Phosphatase: 74 IU/L (ref 44–121)
BUN/Creatinine Ratio: 25 — AB (ref 10–24)
BUN: 16 mg/dL (ref 8–27)
Bilirubin Total: 0.4 mg/dL (ref 0.0–1.2)
CO2: 19 mmol/L — AB (ref 20–29)
Calcium: 9 mg/dL (ref 8.6–10.2)
Chloride: 97 mmol/L (ref 96–106)
Creatinine, Ser: 0.65 mg/dL — AB (ref 0.76–1.27)
Globulin, Total: 2.2 g/dL (ref 1.5–4.5)
Glucose: 96 mg/dL (ref 70–99)
Potassium: 4 mmol/L (ref 3.5–5.2)
Sodium: 131 mmol/L — AB (ref 134–144)
Total Protein: 6.8 g/dL (ref 6.0–8.5)
eGFR: 105 mL/min/1.73 (ref >59)

## 2024-07-22 LAB — PSA, TOTAL AND FREE
PSA, Free Pct: 38.6
PSA, Free: 0.27 ng/mL
Prostate Specific Ag, Serum: 0.7 ng/mL (ref 0.0–4.0)

## 2024-07-22 LAB — LIPID PANEL
Cholesterol, Total: 88 mg/dL — AB (ref 100–199)
HDL: 37 mg/dL — AB (ref >39)
LDL CALC COMMENT:: 2.4 ratio (ref 0.0–5.0)
LDL Chol Calc (NIH): 35 mg/dL (ref 0–99)
Triglycerides: 75 mg/dL (ref 0–149)
VLDL Cholesterol Cal: 16 mg/dL (ref 5–40)

## 2024-07-22 LAB — CBC WITH DIFFERENTIAL/PLATELET
Basophils Absolute: 0.1 x10E3/uL (ref 0.0–0.2)
Basos: 1 %
EOS (ABSOLUTE): 0.1 x10E3/uL (ref 0.0–0.4)
Eos: 1 %
Hematocrit: 45.8 % (ref 37.5–51.0)
Hemoglobin: 15.7 g/dL (ref 13.0–17.7)
Immature Grans (Abs): 0 x10E3/uL (ref 0.0–0.1)
Immature Granulocytes: 0 %
Lymphocytes Absolute: 1.8 x10E3/uL (ref 0.7–3.1)
Lymphs: 20 %
MCH: 31.9 pg (ref 26.6–33.0)
MCHC: 34.3 g/dL (ref 31.5–35.7)
MCV: 93 fL (ref 79–97)
Monocytes Absolute: 0.5 x10E3/uL (ref 0.1–0.9)
Monocytes: 5 %
Neutrophils Absolute: 6.4 x10E3/uL (ref 1.4–7.0)
Neutrophils: 73 %
Platelets: 260 x10E3/uL (ref 150–450)
RBC: 4.92 x10E6/uL (ref 4.14–5.80)
RDW: 12 % (ref 11.6–15.4)
WBC: 8.9 x10E3/uL (ref 3.4–10.8)

## 2024-07-22 LAB — VITAMIN D 25 HYDROXY (VIT D DEFICIENCY, FRACTURES): Vit D, 25-Hydroxy: 65.6 ng/mL (ref 30.0–100.0)

## 2024-07-22 LAB — TSH: TSH: 3.57 u[IU]/mL (ref 0.450–4.500)

## 2024-07-22 NOTE — Telephone Encounter (Signed)
 Copied from CRM #[id number]. Topic: Clinical - Lab/Test Results >> Jul 22, 2024  2:12 PM Daniel Mack wrote: Reason for CRM:  I reviewed his lab results dated 07/22/24. He drinks water all day and does not know how many glasses of water per day. He will increase salt. If he needs to come back in for additional lab, please call him [phone]. He had no questions for the nurse.

## 2024-07-28 LAB — O215-IGE ALPHA-GAL: O215-IgE Alpha-Gal: 64.9 kU/L — AB

## 2024-07-28 LAB — SPECIMEN STATUS REPORT

## 2024-08-09 ENCOUNTER — Other Ambulatory Visit: Payer: Self-pay | Admitting: Family

## 2024-08-09 DIAGNOSIS — N529 Male erectile dysfunction, unspecified: Secondary | ICD-10-CM

## 2024-08-29 DIAGNOSIS — Z1211 Encounter for screening for malignant neoplasm of colon: Secondary | ICD-10-CM | POA: Diagnosis not present

## 2024-09-02 LAB — COLOGUARD: COLOGUARD: NEGATIVE

## 2024-09-11 ENCOUNTER — Encounter (HOSPITAL_BASED_OUTPATIENT_CLINIC_OR_DEPARTMENT_OTHER): Payer: Self-pay | Admitting: Emergency Medicine

## 2024-09-11 ENCOUNTER — Emergency Department (HOSPITAL_BASED_OUTPATIENT_CLINIC_OR_DEPARTMENT_OTHER)
Admission: EM | Admit: 2024-09-11 | Discharge: 2024-09-11 | Disposition: A | Discharge status: Home / Self Care | Attending: Emergency Medicine | Admitting: Emergency Medicine

## 2024-09-11 DIAGNOSIS — K047 Periapical abscess without sinus: Secondary | ICD-10-CM | POA: Insufficient documentation

## 2024-09-11 DIAGNOSIS — K0889 Other specified disorders of teeth and supporting structures: Secondary | ICD-10-CM | POA: Diagnosis present

## 2024-09-11 DIAGNOSIS — Z79899 Other long term (current) drug therapy: Secondary | ICD-10-CM | POA: Diagnosis not present

## 2024-09-11 MED ORDER — IBUPROFEN 400 MG PO TABS
400.0000 mg | ORAL_TABLET | Freq: Once | ORAL | Status: AC
  Administered 2024-09-11: 400 mg via ORAL
  Filled 2024-09-11: qty 1

## 2024-09-11 MED ORDER — TRAMADOL HCL 50 MG PO TABS: 50.0000 mg | ORAL_TABLET | Freq: Four times a day (QID) | ORAL | 0 refills | Status: AC | PRN

## 2024-09-11 MED ORDER — AMOXICILLIN 500 MG PO CAPS
500.0000 mg | ORAL_CAPSULE | Freq: Once | ORAL | Status: AC
  Administered 2024-09-11: 500 mg via ORAL
  Filled 2024-09-11: qty 1

## 2024-09-11 MED ORDER — ACETAMINOPHEN 500 MG PO TABS
1000.0000 mg | ORAL_TABLET | Freq: Once | ORAL | Status: AC
  Administered 2024-09-11: 1000 mg via ORAL
  Filled 2024-09-11: qty 2

## 2024-09-11 MED ORDER — AMOXICILLIN 500 MG PO CAPS: 500.0000 mg | ORAL_CAPSULE | Freq: Three times a day (TID) | ORAL | 0 refills | Status: AC

## 2024-09-11 MED ORDER — LIDOCAINE-EPINEPHRINE (PF) 2 %-1:200000 IJ SOLN
20.0000 mL | Freq: Once | INTRAMUSCULAR | Status: AC
  Administered 2024-09-11: 20 mL
  Filled 2024-09-11: qty 20

## 2024-09-11 NOTE — Discharge Instructions (Addendum)
 It was our pleasure to provide your ER care today - we hope that you feel better.  Take amoxicillin as prescribed. Take acetaminophen  or ibuprofen as need for pain. You may also take ultram as need for pain - no driving when taking.   Follow up closely with dentist/oral in the next 1-2 days - call office tomorrow AM to arrange appointment.  Return to ER if worse, new symptoms, fevers, intractable pain, severe facial swelling, trouble breathing or swallowing, or other emergency concern.

## 2024-09-11 NOTE — ED Provider Notes (Signed)
 [facility] Provider Note   CSN: [id number] Arrival date & time: 09/11/24  1121     Patient presents with: Dental Pain   Daniel Mack is a 63 y.o. male.   Pt with c/o left lower posterior dental pain/swelling in the past few days. Indicates last month had a infected, cracked tooth (?#18) that was removed by Dr Elpidio - area healed, but now feels pain/swelling in the more posterior tooth. No swelling or pain to floor of mouth or neck. No trouble breathing or swallowing. Indicates felt as if had fever.   The history is provided by the patient, medical records and the spouse.  Dental Pain Associated symptoms: fever   Associated symptoms: no headaches        Prior to Admission medications   Medication Sig Start Date End Date Taking? Authorizing Provider  ascorbic acid (VITAMIN C) 1000 MG tablet Take by mouth.    [provider]  diphenhydrAMINE  (BENADRYL ) 25 mg capsule Take 1 capsule (25 mg total) by mouth every 6 (six) hours as needed. 02/04/17   Kirichenko, Tatyana, PA-C  fluticasone  (FLONASE ) 50 MCG/ACT nasal spray Place 2 sprays into both nostrils daily. 12/16/23   Keith, Kayla N, PA-C  ipratropium (ATROVENT) 0.06 % nasal spray USE 2 SPRAYS IN EACH NOSTRIL 3 TIMES DAILY. 12/22/22   Lavell Lye A, FNP  loratadine (CLARITIN) 10 MG tablet Take 10 mg by mouth daily.    [provider]  losartan  (COZAAR ) 100 MG tablet Take 1 tablet (100 mg total) by mouth daily. 07/21/24   Lavell Lye LABOR, FNP  magic mouthwash (nystatin , diphenhydrAMINE , alum & mag hydroxide) suspension mixture Swish and swallow 5 mLs 4 (four) times daily as needed for mouth pain. 07/21/24   Hawks, Lye A, FNP  RESTASIS 0.05 % ophthalmic emulsion 1 drop 2 (two) times daily. 06/20/22   [provider]  rosuvastatin  (CRESTOR ) 10 MG tablet Take 1 tablet (10 mg total) by mouth daily. 07/21/24   Lavell Lye A, FNP  sildenafil  (VIAGRA ) 100 MG tablet  TAKE 1 TABLET BY MOUTH EVERY DAY AS NEEDED FOR ERECTILE DYSFUNCTION 08/09/24   Lavell Lye A, FNP  Testosterone  10 MG/ACT (2%) GEL Place 40 mg onto the skin every morning. 07/21/24   Lavell Lye A, FNP  Testosterone  40.5 MG/2.5GM (1.62%) GEL APPLY THREE PACKETS TO THE SKIN EVERY MORNING 09/29/23   Watt Rush, MD  Vitamin D , Ergocalciferol , (DRISDOL ) 1.25 MG (50000 UNIT) CAPS capsule TAKE 1 CAPSULE (50,000 UNITS TOTAL) BY MOUTH EVERY 7 (SEVEN) DAYS 04/28/24   Lavell Lye LABOR, FNP    Allergies: Other and Bovine (beef) protein-containing drug products    Review of Systems  Constitutional:  Positive for fever.  HENT:  Positive for dental problem. Negative for sore throat and trouble swallowing.   Respiratory:  Negative for cough and shortness of breath.   Gastrointestinal:  Negative for nausea and vomiting.  Neurological:  Negative for headaches.    Updated Vital Signs BP (!) 134/95 (BP Location: Right Arm)   Pulse 78   Temp 98.4 F (36.9 C) (Oral)   Resp 18   SpO2 95%   Physical Exam Vitals and nursing note reviewed.  Constitutional:      Appearance: Normal appearance. He is well-developed.  HENT:     Head: Atraumatic.     Nose: Nose normal.     Mouth/Throat:     Mouth: Mucous membranes are moist.     Pharynx: Oropharynx  is clear.     Comments: Mild swelling, tenderness lateral/inferior to most posterior crowned tooth on left. No trismus. No swelling or pain to floor of mouth or neck. Pharynx appears normal.  Eyes:     General: No scleral icterus.    Conjunctiva/sclera: Conjunctivae normal.  Neck:     Trachea: No tracheal deviation.     Comments: Trachea midline. No neck swelling, pain, tenderness or mass.  Pulmonary:     Effort: Pulmonary effort is normal. No accessory muscle usage or respiratory distress.  Musculoskeletal:     Cervical back: Normal range of motion and neck supple. No rigidity.  Lymphadenopathy:     Cervical: No cervical adenopathy.  Skin:     General: Skin is warm and dry.     Findings: No rash.  Neurological:     Mental Status: He is alert.     Comments: Alert, speech clear.      (all labs ordered are listed, but only abnormal results are displayed) Labs Reviewed - No data to display  EKG: None  Radiology: No results found.   .Incision and Drainage  Date/Time: 09/11/2024 1:25 PM  Performed by: Bernard Drivers, MD Authorized by: Bernard Drivers, MD   Consent:    Consent given by:  Patient   Risks discussed:  Incomplete drainage, pain and bleeding Location:    Type:  Abscess   Location:  Mouth   Mouth location: dental abscess. Anesthesia:    Anesthesia method:  Local infiltration   Local anesthetic:  Lidocaine 2% WITH epi Procedure type:    Complexity:  Simple Procedure details:    Incision types:  Single straight   Incision depth:  Submucosal   Wound management:  Probed and deloculated   Drainage:  Purulent   Drainage amount:  Scant   Wound treatment:  Wound left open    Medications Ordered in the ED  amoxicillin (AMOXIL) capsule 500 mg (has no administration in time range)  ibuprofen (ADVIL) tablet 400 mg (has no administration in time range)  acetaminophen  (TYLENOL ) tablet 1,000 mg (has no administration in time range)  lidocaine-EPINEPHrine  (XYLOCAINE W/EPI) 2 %-1:200000 (PF) injection 20 mL (has no administration in time range)                                    Medical Decision Making Problems Addressed: Dental abscess: acute illness or injury  Amount and/or Complexity of Data Reviewed Independent Historian: spouse    Details: hx  Risk OTC drugs. Prescription drug management.   Pt requests rx amoxicillin, and requests attempt at I and D.   Reviewed nursing notes and prior charts for additional history.   No meds since ~ 8 am (200 mg ibuprofen).   Ibuprofen po, amox po, acetaminophen  po.   I and D.   Rec f/u dentist tomorrow.   Return precautions provided      Final  diagnoses:  None    ED Discharge Orders     None          Bernard Drivers, MD 09/11/24 1329

## 2024-09-11 NOTE — ED Triage Notes (Signed)
 Pt c/o dental pain (LT lower) since Fri; has a dentist, but wanted to get started on abx
